# Patient Record
Sex: Female | Born: 1985 | Race: Black or African American | Hispanic: No | Marital: Single | Smoking: Former smoker
Health system: Southern US, Community
[De-identification: ages and names within clinical notes are randomized; demographics above are authoritative.]

## PROBLEM LIST (undated history)

## (undated) DIAGNOSIS — L0293 Carbuncle, unspecified: Secondary | ICD-10-CM

## (undated) DIAGNOSIS — L0292 Furuncle, unspecified: Secondary | ICD-10-CM

## (undated) HISTORY — PX: CERVICAL CONE BIOPSY: SUR198

## (undated) HISTORY — PX: OTHER SURGICAL HISTORY: SHX169

## (undated) HISTORY — PX: TONSILLECTOMY: SUR1361

## (undated) HISTORY — PX: ADENOIDECTOMY: SUR15

## (undated) HISTORY — PX: COLPOSCOPY: SHX161

## (undated) HISTORY — PX: CHOLECYSTECTOMY: SHX55

---
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
## 2004-03-17 ENCOUNTER — Emergency Department (HOSPITAL_COMMUNITY): Admission: EM | Admit: 2004-03-17 | Discharge: 2004-03-17 | Payer: Self-pay | Admitting: Emergency Medicine

## 2006-04-01 ENCOUNTER — Emergency Department (HOSPITAL_COMMUNITY): Admission: EM | Admit: 2006-04-01 | Discharge: 2006-04-01 | Payer: Self-pay | Admitting: Emergency Medicine

## 2006-08-25 ENCOUNTER — Emergency Department: Payer: Self-pay | Admitting: Emergency Medicine

## 2006-10-31 ENCOUNTER — Emergency Department: Payer: Self-pay | Admitting: Emergency Medicine

## 2006-11-07 ENCOUNTER — Emergency Department (HOSPITAL_COMMUNITY): Admission: EM | Admit: 2006-11-07 | Discharge: 2006-11-07 | Payer: Self-pay | Admitting: Emergency Medicine

## 2007-11-04 ENCOUNTER — Emergency Department (HOSPITAL_COMMUNITY): Admission: EM | Admit: 2007-11-04 | Discharge: 2007-11-04 | Payer: Self-pay | Admitting: Emergency Medicine

## 2008-03-04 ENCOUNTER — Inpatient Hospital Stay (HOSPITAL_COMMUNITY): Admission: AD | Admit: 2008-03-04 | Discharge: 2008-03-04 | Payer: Self-pay | Admitting: Obstetrics & Gynecology

## 2008-04-10 ENCOUNTER — Ambulatory Visit (HOSPITAL_COMMUNITY): Admission: RE | Admit: 2008-04-10 | Discharge: 2008-04-10 | Payer: Self-pay | Admitting: Family Medicine

## 2008-05-01 ENCOUNTER — Ambulatory Visit (HOSPITAL_COMMUNITY): Admission: RE | Admit: 2008-05-01 | Discharge: 2008-05-01 | Payer: Self-pay | Admitting: Family Medicine

## 2008-05-14 ENCOUNTER — Emergency Department (HOSPITAL_COMMUNITY): Admission: EM | Admit: 2008-05-14 | Discharge: 2008-05-14 | Payer: Self-pay | Admitting: Emergency Medicine

## 2008-05-22 ENCOUNTER — Ambulatory Visit (HOSPITAL_COMMUNITY): Admission: RE | Admit: 2008-05-22 | Discharge: 2008-05-22 | Payer: Self-pay | Admitting: Family Medicine

## 2008-06-19 ENCOUNTER — Ambulatory Visit (HOSPITAL_COMMUNITY): Admission: RE | Admit: 2008-06-19 | Discharge: 2008-06-19 | Payer: Self-pay | Admitting: Family Medicine

## 2008-07-20 ENCOUNTER — Inpatient Hospital Stay (HOSPITAL_COMMUNITY): Admission: AD | Admit: 2008-07-20 | Discharge: 2008-07-21 | Payer: Self-pay | Admitting: Obstetrics & Gynecology

## 2008-09-27 ENCOUNTER — Inpatient Hospital Stay (HOSPITAL_COMMUNITY): Admission: AD | Admit: 2008-09-27 | Discharge: 2008-09-27 | Payer: Self-pay | Admitting: Obstetrics & Gynecology

## 2008-10-22 ENCOUNTER — Inpatient Hospital Stay (HOSPITAL_COMMUNITY): Admission: AD | Admit: 2008-10-22 | Discharge: 2008-10-23 | Payer: Self-pay | Admitting: Obstetrics

## 2008-10-25 ENCOUNTER — Inpatient Hospital Stay (HOSPITAL_COMMUNITY): Admission: AD | Admit: 2008-10-25 | Discharge: 2008-10-29 | Payer: Self-pay | Admitting: Obstetrics & Gynecology

## 2009-05-20 ENCOUNTER — Emergency Department (HOSPITAL_COMMUNITY): Admission: EM | Admit: 2009-05-20 | Discharge: 2009-05-20 | Payer: Self-pay | Admitting: Emergency Medicine

## 2009-05-23 ENCOUNTER — Emergency Department (HOSPITAL_COMMUNITY): Admission: EM | Admit: 2009-05-23 | Discharge: 2009-05-23 | Payer: Self-pay | Admitting: Emergency Medicine

## 2009-06-08 ENCOUNTER — Emergency Department (HOSPITAL_COMMUNITY): Admission: EM | Admit: 2009-06-08 | Discharge: 2009-06-08 | Payer: Self-pay | Admitting: Emergency Medicine

## 2009-08-06 ENCOUNTER — Emergency Department (HOSPITAL_COMMUNITY): Admission: EM | Admit: 2009-08-06 | Discharge: 2009-08-06 | Payer: Self-pay | Admitting: Emergency Medicine

## 2009-11-01 ENCOUNTER — Emergency Department (HOSPITAL_COMMUNITY): Admission: EM | Admit: 2009-11-01 | Discharge: 2009-11-01 | Payer: Self-pay | Admitting: Emergency Medicine

## 2010-04-13 IMAGING — US US OB DETAIL+14 WK
1 series · 18 of 28 positions shown · non-contrast
Comparison: none

OBSTETRICAL ULTRASOUND:
 This ultrasound was performed in The [HOSPITAL], and the [facility] report will be stored to [REDACTED] PACS.

[Series 1: us ob detail+14 wk · 18 of 87 slices shown]
[im 1/87]
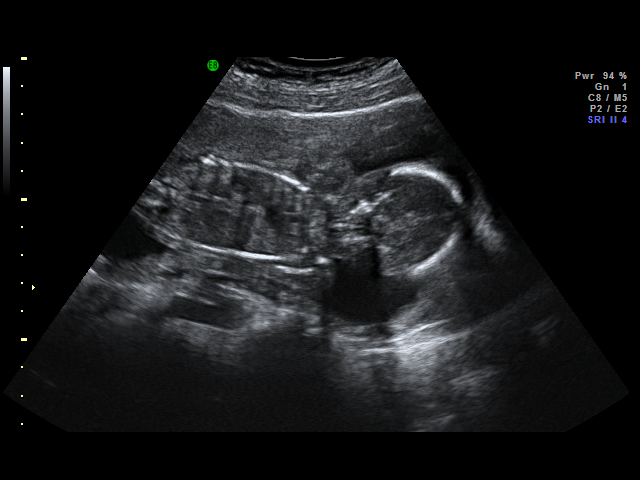
[im 7/87]
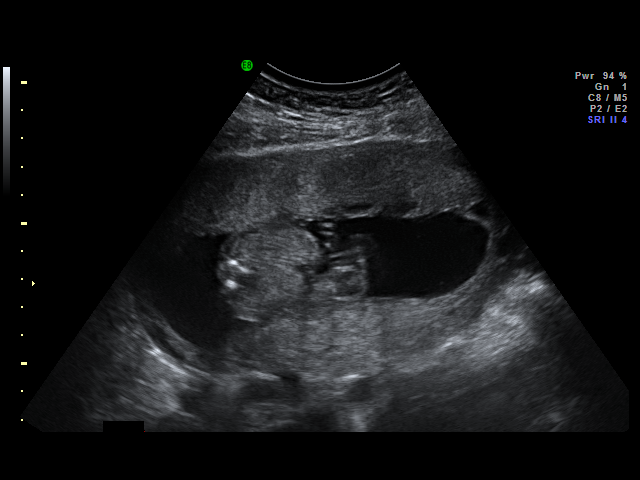
[im 10/87]
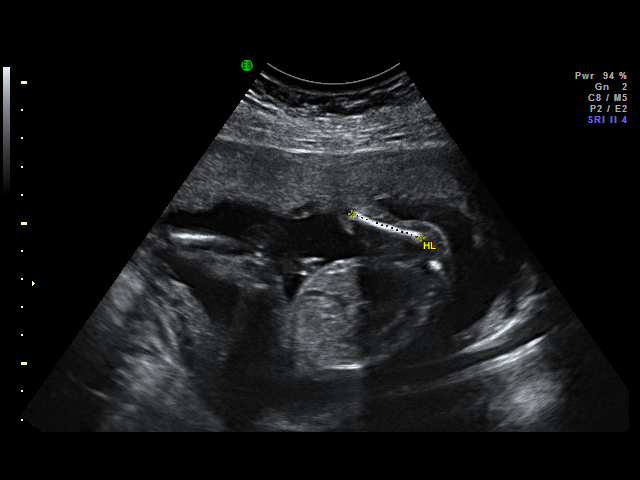
[im 16/87]
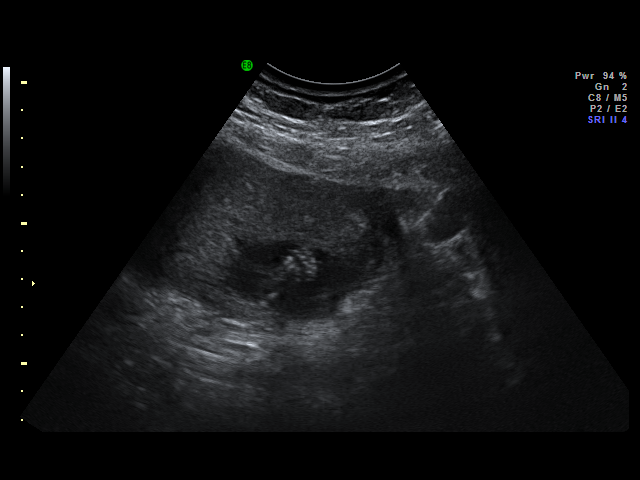
[im 23/87]
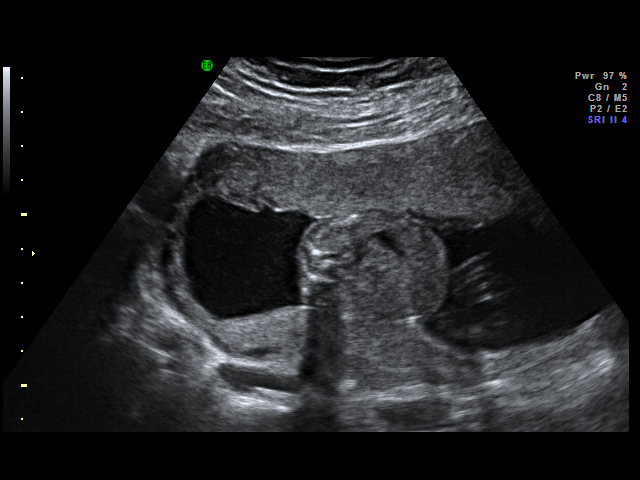
[im 26/87]
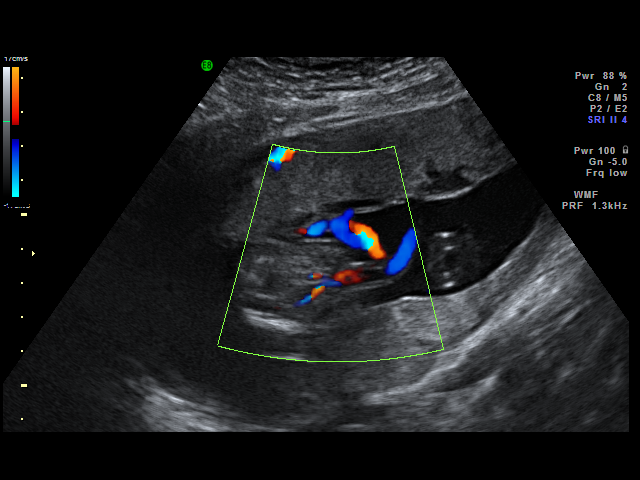
[im 32/87]
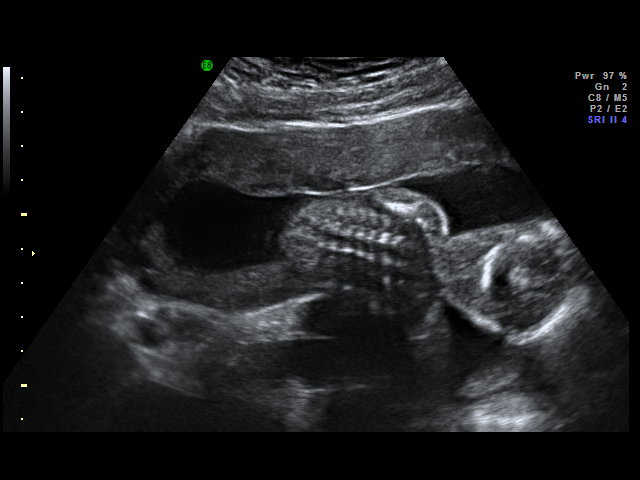
[im 36/87]
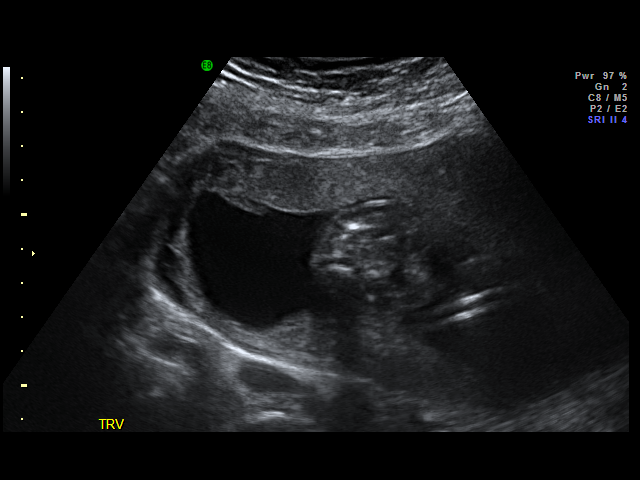
[im 42/87]
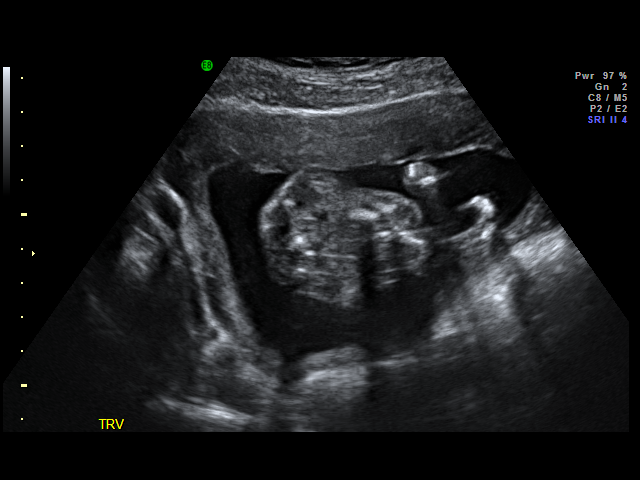
[im 45/87]
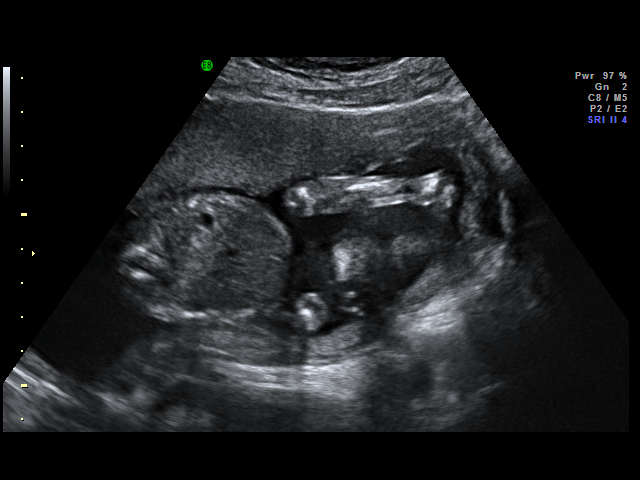
[im 51/87]
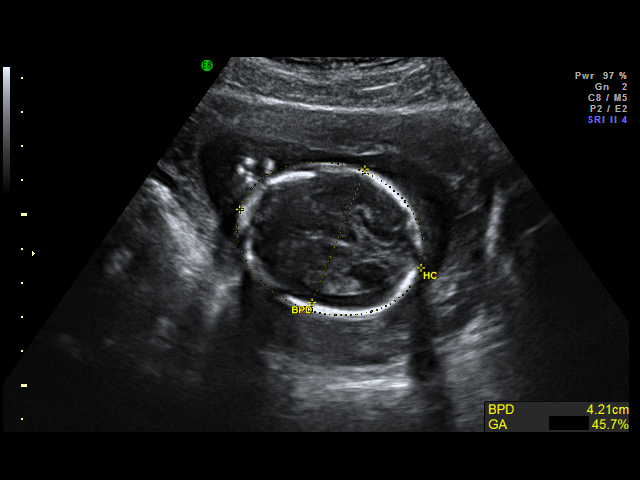
[im 55/87]
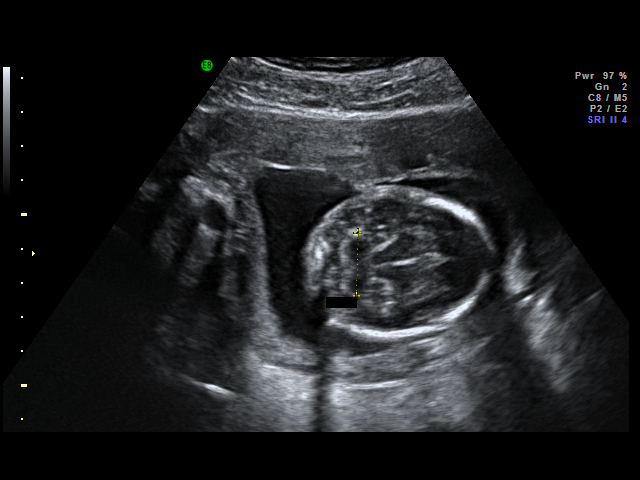
[im 61/87]
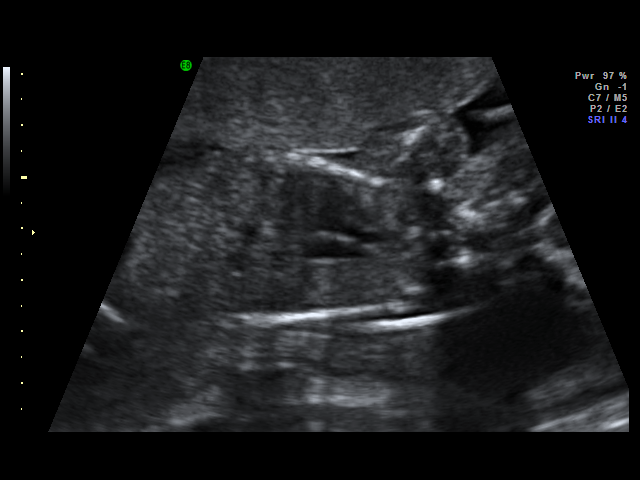
[im 67/87]
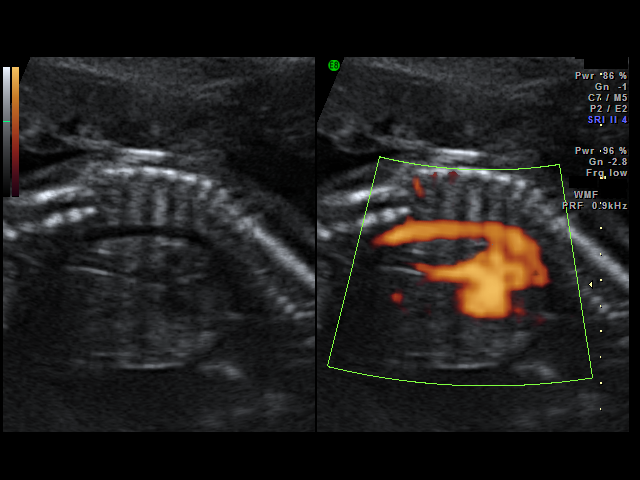
[im 71/87]
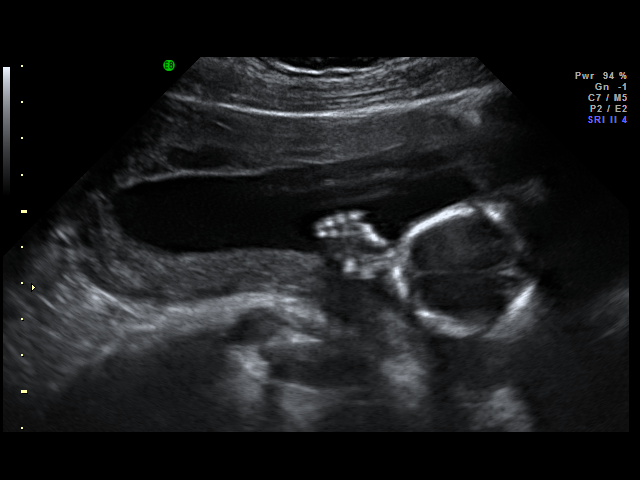
[im 77/87]
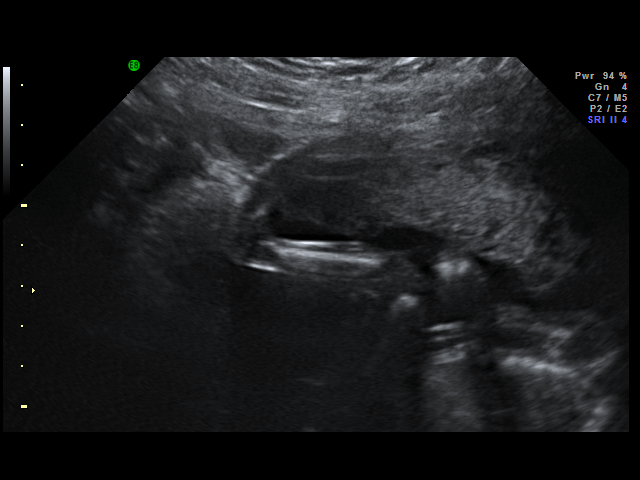
[im 80/87]
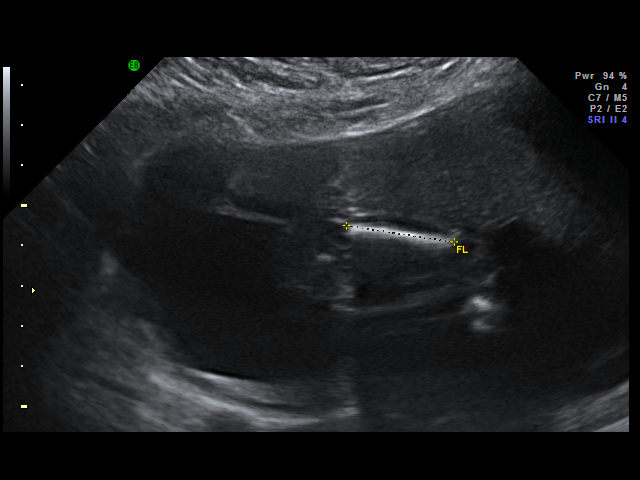
[im 87/87]
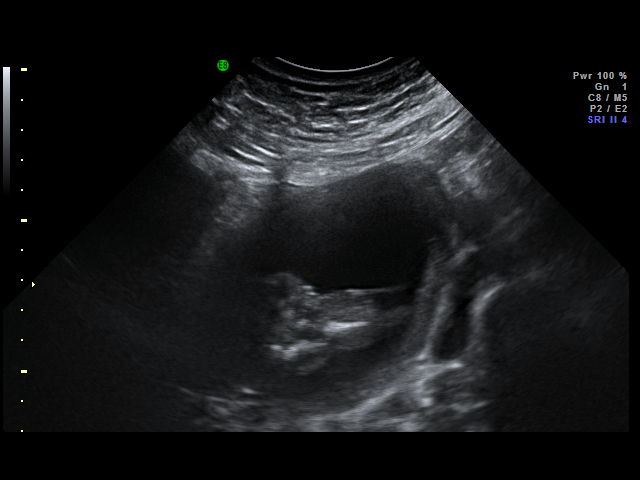

[18 of 28 positions shown; findings below may reference images not displayed]

IMPRESSION: [facility] has also been faxed to the ordering physician.

## 2010-04-26 ENCOUNTER — Emergency Department (HOSPITAL_COMMUNITY): Admission: EM | Admit: 2010-04-26 | Discharge: 2010-04-26 | Payer: Self-pay | Admitting: Family Medicine

## 2010-04-26 ENCOUNTER — Emergency Department (HOSPITAL_COMMUNITY): Admission: EM | Admit: 2010-04-26 | Discharge: 2010-04-26 | Payer: Self-pay | Admitting: Emergency Medicine

## 2010-06-29 ENCOUNTER — Emergency Department (HOSPITAL_COMMUNITY)
Admission: EM | Admit: 2010-06-29 | Discharge: 2010-06-30 | Payer: Self-pay | Source: Home / Self Care | Admitting: Emergency Medicine

## 2010-09-03 ENCOUNTER — Emergency Department (HOSPITAL_COMMUNITY)
Admission: EM | Admit: 2010-09-03 | Discharge: 2010-09-03 | Disposition: A | Discharge status: Home / Self Care | Payer: Medicaid Other | Attending: Emergency Medicine | Admitting: Emergency Medicine

## 2010-09-03 DIAGNOSIS — M25469 Effusion, unspecified knee: Secondary | ICD-10-CM | POA: Insufficient documentation

## 2010-09-03 DIAGNOSIS — M25569 Pain in unspecified knee: Secondary | ICD-10-CM | POA: Insufficient documentation

## 2010-10-03 ENCOUNTER — Ambulatory Visit (HOSPITAL_COMMUNITY)
Admission: RE | Admit: 2010-10-03 | Payer: Medicaid Other | Source: Ambulatory Visit | Admitting: Obstetrics & Gynecology

## 2010-10-04 LAB — WET PREP, GENITAL: Trich, Wet Prep: NONE SEEN

## 2010-10-04 LAB — GC/CHLAMYDIA PROBE AMP, GENITAL
Chlamydia, DNA Probe: NEGATIVE
GC Probe Amp, Genital: NEGATIVE

## 2010-10-07 ENCOUNTER — Ambulatory Visit (HOSPITAL_COMMUNITY)
Admission: RE | Admit: 2010-10-07 | Discharge: 2010-10-07 | Disposition: A | Discharge status: Home / Self Care | Payer: Medicaid Other | Source: Ambulatory Visit | Attending: Obstetrics & Gynecology | Admitting: Obstetrics & Gynecology

## 2010-10-07 DIAGNOSIS — N9089 Other specified noninflammatory disorders of vulva and perineum: Secondary | ICD-10-CM | POA: Insufficient documentation

## 2010-10-07 DIAGNOSIS — Z30432 Encounter for removal of intrauterine contraceptive device: Secondary | ICD-10-CM | POA: Insufficient documentation

## 2010-10-07 LAB — CBC
Hemoglobin: 11.9 g/dL — ABNORMAL LOW (ref 12.0–15.0)
MCH: 21.8 pg — ABNORMAL LOW (ref 26.0–34.0)
MCHC: 31.6 g/dL (ref 30.0–36.0)

## 2010-10-07 LAB — PREGNANCY, URINE: Preg Test, Ur: NEGATIVE

## 2010-10-10 ENCOUNTER — Other Ambulatory Visit: Payer: Self-pay | Admitting: Obstetrics & Gynecology

## 2010-10-13 NOTE — Op Note (Signed)
  NAMECALISE, Tammy [name]:  [id number]  MEDICAL RECORD NO.:  [id number]           PATIENT TYPE:  O  LOCATION:  [facility]                          [facility]  PHYSICIAN:  Roseanna Rainbow, M.D.DATE OF BIRTH:  01/31/1986  DATE OF PROCEDURE:  10/07/2010 DATE OF DISCHARGE:                              OPERATIVE REPORT   PREOPERATIVE DIAGNOSIS:  Left-sided vulvar cyst, intrauterine device in situ.  POSTOPERATIVE DIAGNOSIS:  Left-sided vulvar cyst, intrauterine device in situ.  PROCEDURE:  Excision of left vulvar cyst, removal of a Mirena intrauterine device.  SURGEON:  Roseanna Rainbow, MD.  ANESTHESIA:  Laryngeal mask airway, local.  COMPLICATIONS:  None.  ESTIMATED BLOOD LOSS:  100 mL.  FINDINGS:  There was a left-sided vulvar cyst in the labia majus. During the dissection, the cyst wall was ruptured and mucinous material was noted.  The IUD was removed intact.  PROCEDURE IN DETAILS:  The patient was taken to the operating room.  A laryngeal mask airway was placed.  She was placed in the semilithotomy position in Kep'el stirrups and prepped and draped in the usual sterile fashion.  After time-out had been completed, the overlying skin over the cyst was then incised and the dissection was carried down to the cyst wall.  Using sharp and blunt dissection, the cyst wall was enucleated. Bleeding points were clamped with hemostats.  These were secured with 3- 0 Vicryl sutures.  The defect was then closed in layers using running sutures of 2-0 Vicryl.  The skin was closed using interrupted subcuticular vertical mattress sutures using the 3-0 Vicryl suture.  The wound was irrigated.  Adequate hemostasis was noted.  A speculum was placed in the patient's vagina.  The IUD strings were grasped and the IUD was removed intact.  At the close of the procedure, the instrument and pack counts were said to be correct x2.  The patient was taken to the PACU awake  and in stable condition.     Roseanna Rainbow, M.D.     [name]  D:  10/07/2010  T:  10/08/2010  Job:  [id number]  Electronically Signed by Antionette Char M.D. on 10/13/2010 07:12:38 PM

## 2010-10-26 LAB — RPR: RPR Ser Ql: NONREACTIVE

## 2010-10-26 LAB — CBC
HCT: 31.9 % — ABNORMAL LOW (ref 36.0–46.0)
Hemoglobin: 9.9 g/dL — ABNORMAL LOW (ref 12.0–15.0)
MCHC: 31.8 g/dL (ref 30.0–36.0)
Platelets: 296 10*3/uL (ref 150–400)
Platelets: 307 10*3/uL (ref 150–400)
Platelets: 345 10*3/uL (ref 150–400)
RBC: 4.34 MIL/uL (ref 3.87–5.11)
RDW: 15.9 % — ABNORMAL HIGH (ref 11.5–15.5)
RDW: 16.6 % — ABNORMAL HIGH (ref 11.5–15.5)
RDW: 17.1 % — ABNORMAL HIGH (ref 11.5–15.5)

## 2010-10-26 LAB — CCBB MATERNAL DONOR DRAW

## 2010-10-31 LAB — URINALYSIS, ROUTINE W REFLEX MICROSCOPIC
Nitrite: NEGATIVE
Specific Gravity, Urine: <1.005 — ABNORMAL LOW (ref 1.005–1.030)
Urobilinogen, UA: 0.2 mg/dL (ref 0.0–1.0)

## 2010-11-29 NOTE — H&P (Signed)
Tammy Francis, Tammy [name]:  [id number]   MEDICAL RECORD NO.:  [id number]          PATIENT TYPE:  INP   LOCATION:  9167                          [facility]   PHYSICIAN:  Roseanna Rainbow, M.D.DATE OF BIRTH:  1986/05/20   DATE OF ADMISSION:  10/22/2008  DATE OF DISCHARGE:                              HISTORY & PHYSICAL   CHIEF COMPLAINT:  The patient is a 25 year old para 0 with an estimated  date of confinement of October 23, 2008, with an intrauterine pregnancy at  39 plus weeks complaining of contractions.   HISTORY OF PRESENT ILLNESS:  Please see the above.  While being  monitored in the Maternity Admission Unit there was a moderate variable  deceleration noted.  A subsequent BPP was 8/8.  The amniotic fluid index  was 7.49.  The fetal heart tracing was reviewed.  There were no further  decelerations.  The patient denies rupture of membranes.   ALLERGIES:  PENICILLIN and BEESTINGS.   MEDICATIONS:  Prenatal vitamins.   OB RISK FACTORS:  History of tobacco use.   PRENATAL LABS:  Chlamydia probe negative.  GC probe negative.  Urine  culture and sensitivity, no growth.  One hour GCT 111.  Hepatitis B  surface antigen negative.  Hematocrit 32.3.  Hemoglobin 10.2.  HIV  nonreactive.  Platelets 293,000.  Blood type is O positive.  Antibody  screen negative.  RPR nonreactive.  Rubella equivocal.  Sickle cell  negative.  GBS negative on September 16, 2008.   PAST GYN HISTORY:  There is history of cervical dysplasia.  She is  status post LEEP procedure.   PAST MEDICAL HISTORY:  No significant history of medical disease.   PAST SURGICAL HISTORY:  Tonsils and adenoid.   SOCIAL HISTORY:  She works in [employer].  She is single, does not  give any significant history of alcohol use.  Currently smokes less than  one-half pack per day, reports stress at work, and denies illicit drug  use.   FAMILY HISTORY:  Remarkable for diabetes mellitus.   PHYSICAL  EXAMINATION:  Vital signs stable, afebrile.  Fetal heart  tracing reassuring.  Tocodynamometer, regular uterine contractions.  Sterile vaginal exam per the RN.  The cervix was 1cm dilated, 60%  effaced.   ASSESSMENT:  Primigravida at term, borderline low amniotic fluid index,  prodromal labor.  Fetal heart tracing and biophysical profile consistent  with fetal well being.   PLAN:  Admission, possible augmentation of labor with low-dose Pitocin  per protocol.      Roseanna Rainbow, M.D.  Electronically Signed     LAJ/MEDQ  D:  10/22/2008  T:  10/23/2008  Job:  [id number]

## 2010-11-29 NOTE — Op Note (Signed)
Tammy Francis, Tammy Francis              ACCOUNT NO.:  [id number]   MEDICAL RECORD NO.:  [id number]          PATIENT TYPE:  INP   LOCATION:  9127                          [facility]   PHYSICIAN:  Roseanna Rainbow, M.D.DATE OF BIRTH:  [birth year]   DATE OF PROCEDURE:  10/26/2008  DATE OF DISCHARGE:                               OPERATIVE REPORT   PREOPERATIVE DIAGNOSES:  Intrauterine pregnancy at 40 plus weeks, arrest  of dilatation, active phase of labor, suspicious fetal heart tracing  with repetitive variable decelerations.   POSTOPERATIVE DIAGNOSES:  Intrauterine pregnancy at 40 plus weeks,  arrest of dilatation, active phase of labor, suspicious fetal heart  tracing with repetitive variable decelerations.   PROCEDURE:  Primary low uterine flap elliptical cesarean delivery via  Pfannenstiel skin incision.   SURGEON:  Roseanna Rainbow, MD   ANESTHESIA:  Epidural.   ESTIMATED BLOOD LOSS:  600 mL.   IV FLUIDS:  As per Anesthesiology   URINE OUTPUT:  As per Anesthesiology.   COMPLICATIONS:  None.   FINDINGS:  Live born female, occiput posterior position.  The weight was  7 pounds 5 ounces.   DESCRIPTION OF PROCEDURE:  The patient was taken to the operating room  with an IV running and an epidural catheter in situ.  She was placed in  the dorsal supine position with a leftward tilt and prepped and draped  in the usual sterile fashion.  After time-out had been completed, a  transverse skin incision was then made with a scalpel and carried down  to the underlying fascia with the Bovie.  The fascia was nicked in the  midline.  The fascial incision was then extended bilaterally with curved  Mayo scissors.  The superior aspect of the fascial incision was tented  up and the underlying rectus muscles dissected off.  The inferior aspect  of the fascial incision was manipulated in a similar fashion.  The  rectus muscles were separated in the midline.  The parietal  peritoneum  was tented up and entered.  This incision was then extended superiorly  and inferiorly with good visualization of the bladder.  The bladder  blade was then placed.  The vesicouterine peritoneum was tented up and  entered sharply.  This incision was then extended bilaterally and the  bladder flap created bluntly.  The bladder blade was then replaced.  The  lower uterine segment was incised in a transverse fashion with a  scalpel.  The incision was then extended bluntly.  The infant's head was  delivered atraumatically.  The oropharynx was suctioned with a bulb  suction.  The cord was clamped and cut.  The infant was handed off to  the awaiting neonatologist.  The placenta was then removed.  The  intrauterine cavity was evacuated of any remaining amniotic fluid clots  and debris with moist laparotomy sponge.  The uterine incision was then  reapproximated in a running interlocking fashion using a suture of 0  Monocryl.  A second imbricating layer of the same suture was then  placed.  Bleeding points were secured with figure-of-eight sutures of  the same.  Adequate hemostasis was noted.  The paracolic gutters were  then irrigated.  The parietal peritoneum was reapproximated in a running  fashion using a suture of 2-0 Vicryl.  The fascia was reapproximated  with two running sutures of 0 Vicryl tied in the midline.  The skin was  closed with staples.  After closure of the procedure, the instruments  and pack counts were said to be correct x2.  The patient was taken to  the PACU awake and in stable condition.      Roseanna Rainbow, M.D.  Electronically Signed     LAJ/MEDQ  D:  10/26/2008  T:  10/27/2008  Job:  [id number]

## 2010-11-29 NOTE — Discharge Summary (Signed)
Tammy [name]:  [id number]   MEDICAL RECORD NO.:  [id number]          PATIENT TYPE:  INP   LOCATION:  9127                          [facility]   PHYSICIAN:  Roseanna Rainbow, M.D.DATE OF BIRTH:  1986-03-23   DATE OF ADMISSION:  10/25/2008  DATE OF DISCHARGE:  10/29/2008                               DISCHARGE SUMMARY   CHIEF COMPLAINT:  The patient is a 25 year old para 0 with an estimated  date of confinement of October 23, 2008 with an intrauterine pregnancy at  40 plus weeks for induction of labor.  Please see the dictated history  and physical.   HOSPITAL COURSE:  The patient was admitted; the cervix was ripened with  Cervidil.  After the Cervidil was removed, low dose of Pitocin was  started.  The membranes were artificially ruptured in late labor at 5-6  cm of cervical dilatation.  At one point, there were variable  decelerations noted on the fetal heart tracing, an intrauterine pressure  catheter was placed and an amnioinfusion was started.  There was some  improvement in the fetal heart tracing transiently, however, the  variable decelerations recurred.  There was no further dilatation beyond  6 cm of dilatation.  In light of the fetal heart tracing, the [place]  units could not be increased beyond 130 MVUs.  At this point, the  decision was made to proceed with a cesarean delivery for arrest of  dilatation.  Please see the dictated operative summary.  On  postoperative day 1, the hemoglobin was 9.9, which was stable from a  preoperative value.  The remainder of her hospital course was  uneventful.  She was discharged to home on postoperative day 3,  tolerating regular diet.   A psychosocial assessment was also performed during the hospitalization  by a clinical social worker.   DISCHARGE DIAGNOSES:  1. Intrauterine pregnancy at term.  2. Arrest of dilatation.  3. Active phase of labor.   PROCEDURE:  Cesarean delivery.   CONDITION:  Good.   DIET:  Regular.   ACTIVITY:  Pelvic rest and progressive activity.   MEDICATIONS:  Darvocet and ibuprofen.   DISPOSITION:  The patient is to follow up in the office in 2 weeks.  A  referral was made to Child Protective Services per the social worker.      Roseanna Rainbow, M.D.  Electronically Signed     LAJ/MEDQ  D:  10/29/2008  T:  10/30/2008  Job:  [id number]

## 2010-11-29 NOTE — H&P (Signed)
Tammy Francis, FAZEKAS NO.:  [id number]   MEDICAL RECORD NO.:  [id number]          PATIENT TYPE:  INP   LOCATION:  9127                          [facility]   PHYSICIAN:  Roseanna Rainbow, M.D.DATE OF BIRTH:  [birth year]   DATE OF ADMISSION:  10/25/2008  DATE OF DISCHARGE:                              HISTORY & PHYSICAL   CHIEF COMPLAINT:  The patient is a 25 year old para 0 with an estimated  date of confinement of October 23, 2008, with an intrauterine pregnancy at  40 plus weeks for induction of labor.   HISTORY OF PRESENT ILLNESS:  The patient was recently admitted for  observation several days ago.  A biophysical profile was 8/8 at that  point and the amniotic fluid index was 7.49.  She was discharged to home  with the diagnosis of false labor.   ALLERGIES:  To PENICILLIN and BEE STINGS.   MEDICATIONS:  Prenatal vitamins.   OB RISK FACTORS:  History of tobacco use.   PRENATAL LABS:  Chlamydia probe negative, GC probe negative.  Urine  culture and sensitivity, no growth.  One-hour GCT 111.  Hepatitis B  surface antigen negative.  Hematocrit 32.3, hemoglobin 10.2, HIV  nonreactive, platelets 293,000.  Blood type O positive.  Antibody screen  negative.  RPR nonreactive, rubella equivocal.  Sickle cell negative.  GBS negative on September 16, 2008.   PAST GYN HISTORY:  There is history of cervical dysplasia.  She is  status post LEEP procedure.   PAST MEDICAL HISTORY:  No significant history of medical diseases.   PAST SURGICAL HISTORY:  Tonsils and adenoids.   SOCIAL HISTORY:  She works in [employer], she is single, does not give  any significant history of alcohol usage, currently smokes less than 1/2  pack per day.  Reports stress at work.  Denies illicit drug use.   FAMILY HISTORY:  Remarkable for diabetes mellitus.   PHYSICAL EXAMINATION:  VITAL SIGNS:  Stable, afebrile.  Fetal heart  tracing reassuring.  Tocodynamometer irregular uterine  contractions.  Sterile vaginal exam of the cervix per the RN is 1 cm dilated and 50%  effaced.   ASSESSMENT:  Primigravida at term, borderline low amniotic fluid index.  Fetal heart tracing reassuring.  Borderline Bishop score.   PLAN:  Admission, 2-stage induction of labor.      Roseanna Rainbow, M.D.  Electronically Signed     LAJ/MEDQ  D:  10/26/2008  T:  10/26/2008  Job:  [id number]

## 2010-12-02 NOTE — H&P (Signed)
NAME:  Tammy Francis, Tammy Francis                        ACCOUNT NO.:  [id number]   MEDICAL RECORD NO.:  [id number]                  PATIENT TYPE:   LOCATION:                                       FACILITY:  [facility]   PHYSICIAN:  Tilda Burrow, M.D.              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  Not willing to  consider outpatient therapy.   HISTORY OF PRESENT ILLNESS:  This 25 year old primiparous female referred to  our office courtesy of [facility] for colposcopy for  cervical dysplasia noted on Pap smear with cells suspicious for high-grade  lesion.  She was seen in our office on October 05, 2003, and colposcopic  evaluation showed dysplastic tissue at the squamocolumnar junction.  Biopsy  was taken at 6 o'clock on the cervix, which showed mild to moderate  dysplasia.  The patient has been seen back in our office and counseled over  the findings and recommended to have excision of the abnormal cells.  Consideration of cryocautery was declined.  The patient had a very tearful  response to cervical biopsy even after topical anesthetic had been applied.  So after considering things, she has decided to request anesthesia for the  cervical procedure rather than pericervical block in the office.  The pros  and cons of the procedure have been discussed to the patient's satisfaction.   PAST MEDICAL HISTORY:  Benign.  No injuries.   PAST SURGICAL HISTORY:  Tonsillectomy in 1998 in [place], [place].   MEDICATIONS:  None.   ALLERGIES:  None.   PHYSICAL EXAMINATION:  HEIGHT:  5 feet 4-1/2 inches.  WEIGHT:  190 pounds.  VITAL SIGNS:  Blood pressure 150/72.  GENERAL APPEARANCE:  A healthy African American female, alert and oriented x  3.  HEENT:  Pupils equal, round, and reactive.  Extraocular movements  intact.  NECK:  Supple.  Trachea midline.  CHEST:  Clear to auscultation.  ABDOMEN:  Nontender.  PELVIC:  Cervix with dysplasia at 6 o'clock on microscopic evaluation.  Adnexa negative for masses.   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   PLAN:  Hominum laser ablation of cervical lesion on November 05, 2003.    ___________________________________________                                         Tilda Burrow, M.D.   JVF/MEDQ  D:  10/27/2003  T:  10/27/2003  Job:  [id number]

## 2010-12-02 NOTE — Op Note (Signed)
NAME:  Tammy Francis, Tammy Francis                        ACCOUNT NO.:  [id number]   MEDICAL RECORD NO.:  [id number]                   PATIENT TYPE:  AMB   LOCATION:  DAY                                  FACILITY:  [facility]   PHYSICIAN:  Tilda Burrow, M.D.              DATE OF BIRTH:  1985-09-29   DATE OF PROCEDURE:  DATE OF DISCHARGE:                                 OPERATIVE REPORT   PREOPERATIVE DIAGNOSIS:  Cervical dysplasia, mild-to-moderate.   POSTOPERATIVE DIAGNOSIS:  Cervical dysplasia, mild-to-moderate.   PROCEDURE:  Holmium laser ablation of the cervix (conization).   SURGEON:  Tilda Burrow, M.D.   ASSISTANTRolm Baptise, C.S.T.   ANESTHESIA:  General with LMA.   COMPLICATIONS:  None.   FINDINGS:  A well-demarcated transition zone with previous biopsy-proven  cervical dysplasia, mild-to-moderate dysplasia.   DESCRIPTION OF PROCEDURE:  The patient was taken to the operating room and  prepped and draped with the legs in low lithotomy support.   A nonreflective speculum was inserted into the vagina.  The cervix was  prepped and the perineum draped with moistened laparotomy tapes.  We then  placed Lugol's solution on the cervix to demarcate the squamocolumnar  junction.  Then using the holmium laser at low to moderate settings as  documented in the record, we proceeded to use laser ablation of the surface  for a complete sloughing of the surface skin to a depth of approximately 3  to 5 mm.  The procedure left the cervix completely without bleeding  occurring.  A paracervical block with Marcaine had been applied prior to  beginning the procedure using 10 cc of Marcaine with epinephrine.   The patient was then allowed to go to the recovery room in good condition.  Sponge and needle counts were correct.      ___________________________________________                                            Tilda Burrow, M.D.   JVF/MEDQ  D:  11/13/2003  T:  11/13/2003  Job:  [id number]

## 2011-04-04 ENCOUNTER — Emergency Department (HOSPITAL_COMMUNITY): Payer: Medicaid Other

## 2011-04-04 ENCOUNTER — Emergency Department (HOSPITAL_COMMUNITY)
Admission: EM | Admit: 2011-04-04 | Discharge: 2011-04-04 | Disposition: A | Discharge status: Home / Self Care | Payer: Medicaid Other | Attending: Emergency Medicine | Admitting: Emergency Medicine

## 2011-04-04 DIAGNOSIS — M79609 Pain in unspecified limb: Secondary | ICD-10-CM | POA: Insufficient documentation

## 2011-04-04 DIAGNOSIS — R059 Cough, unspecified: Secondary | ICD-10-CM | POA: Insufficient documentation

## 2011-04-04 DIAGNOSIS — J069 Acute upper respiratory infection, unspecified: Secondary | ICD-10-CM | POA: Insufficient documentation

## 2011-04-04 DIAGNOSIS — R05 Cough: Secondary | ICD-10-CM | POA: Insufficient documentation

## 2011-04-11 LAB — POCT PREGNANCY, URINE
Operator id: 151321
Preg Test, Ur: NEGATIVE

## 2011-04-11 LAB — URINALYSIS, ROUTINE W REFLEX MICROSCOPIC
Bilirubin Urine: NEGATIVE
Ketones, ur: NEGATIVE
Nitrite: NEGATIVE
Urobilinogen, UA: 1

## 2011-04-11 LAB — URINE MICROSCOPIC-ADD ON

## 2011-05-23 ENCOUNTER — Encounter: Payer: Self-pay | Admitting: Emergency Medicine

## 2011-05-23 ENCOUNTER — Emergency Department (HOSPITAL_COMMUNITY)
Admission: EM | Admit: 2011-05-23 | Discharge: 2011-05-23 | Disposition: A | Discharge status: Home / Self Care | Payer: Self-pay | Attending: Emergency Medicine | Admitting: Emergency Medicine

## 2011-05-23 DIAGNOSIS — M79609 Pain in unspecified limb: Secondary | ICD-10-CM | POA: Insufficient documentation

## 2011-05-23 DIAGNOSIS — M7989 Other specified soft tissue disorders: Secondary | ICD-10-CM | POA: Insufficient documentation

## 2011-05-23 DIAGNOSIS — IMO0002 Reserved for concepts with insufficient information to code with codable children: Secondary | ICD-10-CM | POA: Insufficient documentation

## 2011-05-23 DIAGNOSIS — L02411 Cutaneous abscess of right axilla: Secondary | ICD-10-CM

## 2011-05-23 MED ORDER — LIDOCAINE HCL 1 % IJ SOLN
INTRAMUSCULAR | Status: AC
  Administered 2011-05-23: 16:00:00
  Filled 2011-05-23: qty 20

## 2011-05-23 MED ORDER — DOXYCYCLINE HYCLATE 100 MG PO CAPS: 100.0000 mg | ORAL_CAPSULE | Freq: Two times a day (BID) | ORAL | Status: AC

## 2011-05-23 MED ORDER — HYDROCODONE-ACETAMINOPHEN 5-325 MG PO TABS: 2.0000 | ORAL_TABLET | ORAL | Status: AC | PRN

## 2011-05-23 MED ORDER — LIDOCAINE HCL 2 % IJ SOLN: 10.0000 mL | Freq: Once | INTRAMUSCULAR | Status: DC

## 2011-05-23 NOTE — ED Provider Notes (Signed)
History     CSN: [id number] Arrival date & time: 05/23/2011 12:13 PM   First MD Initiated Contact with Patient 05/23/11 1431      Chief Complaint  Patient presents with  . Recurrent Skin Infections  . Toe Pain    (Consider location/radiation/quality/duration/timing/severity/associated sxs/prior treatment) Patient is a 25 y.o. female presenting with abscess.  Abscess  This is a new problem. The current episode started less than one week ago. The onset was gradual. The problem occurs rarely. The problem has been gradually worsening. Affected Location: R axilla. The problem is moderate. The abscess is characterized by redness, painfulness, draining and swelling. Pertinent negatives include no fever.    History reviewed. No pertinent past medical history.  History reviewed. No pertinent past surgical history.  No family history on file.  History  Substance Use Topics  . Smoking status: Not on file  . Smokeless tobacco: Not on file  . Alcohol Use: No    OB History    Grav Para Term Preterm Abortions TAB SAB Ect Mult Living                  Review of Systems  Constitutional: Negative for fever.  All other systems reviewed and are negative.    Allergies  Penicillins cross reactors  Home Medications  No current outpatient prescriptions on file.  BP 122/69  Pulse 70  Temp(Src) 98 F (36.7 C) (Oral)  Resp 20  Ht 5\' 5"  (1.651 m)  Wt 195 lb (88.451 kg)  BMI 32.45 kg/m2  SpO2 100%  Physical Exam  Constitutional: She appears well-developed and well-nourished.  Cardiovascular: Normal rate.   Pulmonary/Chest: Effort normal.  Skin: Skin is warm and dry.       R axilla abscess 3cm    ED Course  INCISION AND DRAINAGE Date/Time: 05/23/2011 3:45 PM Performed by: Jethro Bastos Authorized by: Jethro Bastos Consent: Verbal consent obtained. Written consent not obtained. Risks and benefits: risks, benefits and alternatives were discussed Consent given by:  patient Patient understanding: patient states understanding of the procedure being performed Patient consent: the patient's understanding of the procedure matches consent given Site marked: the operative site was marked Patient identity confirmed: verbally with patient, arm band, provided demographic data and hospital-assigned identification number Time out: Immediately prior to procedure a "time out" was called to verify the correct patient, procedure, equipment, support staff and site/side marked as required. Type: abscess Location: axilla. Anesthesia: local infiltration Patient sedated: no Scalpel size: 10 Needle gauge: 25. Incision type: single straight Complexity: simple Drainage: purulent Drainage amount: moderate Wound treatment: wound left open Packing material: 1/4 in iodoform gauze Patient tolerance: Patient tolerated the procedure well with no immediate complications.   (including critical care time)  Labs Reviewed - No data to display No results found.   No diagnosis found.    MDM          Jethro Bastos, NP 05/23/11 1547

## 2011-05-23 NOTE — ED Notes (Signed)
Abscess to rt axilla x 1 wk.  Tried to squeeze it but states it hurt too bad. Pt has stubbed LT pinky toe x 3 over the last 2 wks, last time was few days ago.

## 2011-05-23 NOTE — ED Notes (Signed)
Patient is resting comfortably. Has access to callbell, is watching TV and has been updated that the PA will see her shortly.

## 2011-05-23 NOTE — ED Notes (Signed)
Pt states that she has a boil to rt axillary and also pain to lt foot no inury

## 2011-05-23 NOTE — ED Provider Notes (Signed)
Medical screening examination/treatment/procedure(s) were performed by non-physician practitioner and as supervising physician I was immediately available for consultation/collaboration.   Alylah Blakney, MD 05/23/11 1656 

## 2011-05-27 ENCOUNTER — Encounter (HOSPITAL_COMMUNITY): Payer: Self-pay | Admitting: *Deleted

## 2011-05-27 ENCOUNTER — Emergency Department (HOSPITAL_COMMUNITY)
Admission: EM | Admit: 2011-05-27 | Discharge: 2011-05-27 | Disposition: A | Discharge status: Home / Self Care | Payer: Self-pay | Attending: Emergency Medicine | Admitting: Emergency Medicine

## 2011-05-27 DIAGNOSIS — F17200 Nicotine dependence, unspecified, uncomplicated: Secondary | ICD-10-CM | POA: Insufficient documentation

## 2011-05-27 DIAGNOSIS — IMO0002 Reserved for concepts with insufficient information to code with codable children: Secondary | ICD-10-CM | POA: Insufficient documentation

## 2011-05-27 DIAGNOSIS — L02411 Cutaneous abscess of right axilla: Secondary | ICD-10-CM

## 2011-05-27 NOTE — ED Provider Notes (Addendum)
History     CSN: [id number] Arrival date & time: 05/27/2011  8:43 PM   None     Chief Complaint  Patient presents with  . Wound Check    (Consider location/radiation/quality/duration/timing/severity/associated sxs/prior treatment) HPI  Patient was seen in emergency department 5 days ago for abscess in her right axilla who had I&D at that time and was placed on doxycycline returns to the emergency department as instructed for wound recheck. Patient states that her pain has been improving with a great improvement in the swelling and the pain. Patient denies fevers, chills, skin redness or drainage from incision site. Patient states packing has remained in place. Symptoms are gradually improving. No other associated symptoms. No aggravating or alleviating factors. History reviewed. No pertinent past medical history.  History reviewed. No pertinent past surgical history.  History reviewed. No pertinent family history.  History  Substance Use Topics  . Smoking status: Current Everyday Smoker  . Smokeless tobacco: Not on file  . Alcohol Use: No    OB History    Grav Para Term Preterm Abortions TAB SAB Ect Mult Living                  Review of Systems  All other systems reviewed and are negative.    Allergies  Vicodin and Penicillins cross reactors  Home Medications   Current Outpatient Rx  Name Route Sig Dispense Refill  . DOXYCYCLINE HYCLATE 100 MG PO CAPS Oral Take 1 capsule (100 mg total) by mouth 2 (two) times daily. 20 capsule 0  . HYDROCODONE-ACETAMINOPHEN 5-325 MG PO TABS Oral Take 2 tablets by mouth every 4 (four) hours as needed for pain (do not drive with this medication). 12 tablet 0    BP 120/64  Pulse 75  Temp(Src) 98.6 F (37 C) (Oral)  Resp 16  SpO2 100%  LMP 05/07/2011  Physical Exam  Nursing note and vitals reviewed. Constitutional: She is oriented to person, place, and time. She appears well-developed and well-nourished. No distress.  HENT:    Head: Normocephalic and atraumatic.  Eyes: Conjunctivae are normal.  Neck: Normal range of motion.  Cardiovascular: Normal rate, regular rhythm, normal heart sounds and intact distal pulses.  Exam reveals no gallop and no friction rub.   No murmur heard. Pulmonary/Chest: Effort normal and breath sounds normal. No respiratory distress. She has no wheezes. She has no rales. She exhibits no tenderness.  Abdominal: Soft. Bowel sounds are normal. She exhibits no distension. There is no tenderness.  Musculoskeletal: Normal range of motion. She exhibits no edema and no tenderness.  Neurological: She is alert and oriented to person, place, and time.  Skin: Skin is warm and dry. No rash noted. She is not diaphoretic. No erythema.       Well healing linear incision of right axilla with packing in place. Scant yellow discharge but no erythema or tenderness to palpation. No fluctuance or induration.  Psychiatric: She has a normal mood and affect.    ED Course  Procedures (including critical care time)  1 recheck with packing removal with prior procedure being an I&D 5 days ago. Well-healing no signs of infection.  Wound recheck with packing removed.  Labs Reviewed - No data to display No results found.   1. Abscess of axilla, right       MDM  Well healing abscess of right axilla with no signs or symptoms of cellulitis. No fluctuants induration of skin. Patient states wound is healing with improving pain.  Jenness Corner, PA 05/28/11 0050  Jenness Corner, PA 08/05/11 [phone]

## 2011-05-27 NOTE — ED Notes (Signed)
Pt in for wound recheck, pt states she had an abscess to right underarm packed and was told to come back to get it re-evaluated

## 2011-05-28 NOTE — ED Provider Notes (Signed)
Medical screening examination/treatment/procedure(s) were performed by non-physician practitioner and as supervising physician I was immediately available for consultation/collaboration.  Hurman Horn, MD 05/28/11 [phone]

## 2011-07-21 ENCOUNTER — Emergency Department (HOSPITAL_COMMUNITY)
Admission: EM | Admit: 2011-07-21 | Discharge: 2011-07-22 | Disposition: A | Discharge status: Home / Self Care | Payer: BC Managed Care – PPO | Attending: Emergency Medicine | Admitting: Emergency Medicine

## 2011-07-21 DIAGNOSIS — M79609 Pain in unspecified limb: Secondary | ICD-10-CM | POA: Insufficient documentation

## 2011-07-21 DIAGNOSIS — F17200 Nicotine dependence, unspecified, uncomplicated: Secondary | ICD-10-CM | POA: Insufficient documentation

## 2011-07-21 DIAGNOSIS — L0291 Cutaneous abscess, unspecified: Secondary | ICD-10-CM

## 2011-07-21 DIAGNOSIS — L299 Pruritus, unspecified: Secondary | ICD-10-CM | POA: Insufficient documentation

## 2011-07-21 DIAGNOSIS — R21 Rash and other nonspecific skin eruption: Secondary | ICD-10-CM | POA: Insufficient documentation

## 2011-07-21 DIAGNOSIS — L02419 Cutaneous abscess of limb, unspecified: Secondary | ICD-10-CM | POA: Insufficient documentation

## 2011-07-22 ENCOUNTER — Encounter (HOSPITAL_COMMUNITY): Payer: Self-pay | Admitting: *Deleted

## 2011-07-22 MED ORDER — LIDOCAINE HCL 2 % IJ SOLN
INTRAMUSCULAR | Status: AC
  Filled 2011-07-22: qty 1

## 2011-07-22 MED ORDER — LIDOCAINE HCL (PF) 1 % IJ SOLN
5.0000 mL | Freq: Once | INTRAMUSCULAR | Status: DC
  Filled 2011-07-22: qty 5

## 2011-07-22 MED ORDER — CLINDAMYCIN HCL 150 MG PO CAPS: 150.0000 mg | ORAL_CAPSULE | Freq: Four times a day (QID) | ORAL | Status: AC

## 2011-07-22 NOTE — ED Notes (Signed)
Starting Sunday, the pt notices a pimple sized bump on her left medial thigh that has gotten progressively larger.  Circa 0400 yesterday morning, the reddened area around said bump was approximately 2.5 inches in diameter as seen by marking that the pt drew around said reddened area.  As of tonight, circa 2300, the reddened area has grown approximately 1.5 inches in diameter.  Pt denies oozing from the site but does state that the site itches and burns.

## 2011-07-22 NOTE — ED Notes (Signed)
Per EDP Dierdre Highman, room set up for the I and D procedure. Material at the bedside.

## 2011-07-22 NOTE — ED Provider Notes (Signed)
History     CSN: [id number]  Arrival date & time 07/21/11  2308   First MD Initiated Contact with Patient 07/22/11 [phone]      Chief Complaint  Patient presents with  . Abscess    multiple areas    (Consider location/radiation/quality/duration/timing/severity/associated sxs/prior treatment) Patient is a 26 y.o. female presenting with abscess. The history is provided by the patient.  Abscess  This is a new problem. The current episode started yesterday. The problem occurs continuously. The problem has been gradually worsening. The abscess is present on the left upper leg. The problem is moderate. The abscess is characterized by itchiness, redness and painfulness. Associated with: She had one on her right inner thigh and has now spread to her left inner thigh. The abscess first occurred at home. Pertinent negatives include no fever and no vomiting. There were no sick contacts. She has received no recent medical care.   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 There is some surrounding redness. Moderate in severity. Pain is sharp in quality and not radiating. Constant since onset.  History reviewed. No pertinent past medical history.  Past Surgical History  Procedure Date  . Bartholins cyst     had gland removed.  . Colposcopy     History reviewed. No pertinent family history.  History  Substance Use Topics  . Smoking status: Current Everyday Smoker  . Smokeless tobacco: Not on file  . Alcohol Use: No    OB History    Grav Para Term Preterm Abortions TAB SAB Ect Mult Living                  Review of Systems  Constitutional: Negative for fever and chills.  HENT: Negative for neck pain and neck stiffness.   Eyes: Negative for pain.  Respiratory: Negative for shortness of breath.   Cardiovascular: Negative for chest pain and palpitations.  Gastrointestinal: Negative for vomiting and  abdominal pain.  Genitourinary: Negative for dysuria.  Musculoskeletal: Negative for back pain.  Skin: Positive for rash.  Neurological: Negative for headaches.  All other systems reviewed and are negative.    Allergies  Vicodin and Penicillins cross reactors  Home Medications  No current outpatient prescriptions on file.  BP 126/68  Pulse 80  Temp(Src) 98.8 F (37.1 C) (Oral)  Resp 18  SpO2 100%  Physical Exam  Constitutional: She is oriented to person, place, and time. She appears well-developed and well-nourished.  HENT:  Head: Normocephalic and atraumatic.  Eyes: Conjunctivae and EOM are normal. Pupils are equal, round, and reactive to light.  Neck: Trachea normal. Neck supple. No thyromegaly present.  Cardiovascular: Normal rate, regular rhythm, S1 normal, S2 normal and normal pulses.     No systolic murmur is present   No diastolic murmur is present  Pulses:      Radial pulses are 2+ on the right side, and 2+ on the left side.  Pulmonary/Chest: Effort normal and breath sounds normal. She has no wheezes. She has no rhonchi. She has no rales. She exhibits no tenderness.  Abdominal: Normal appearance. There is no CVA tenderness and negative Murphy's sign.  Musculoskeletal:       Left lower extremity: Medial thigh with 2-3 cm area of tenderness, fluctuance and pointing. There is surrounding erythema with increased warmth to touch. No lymphadenopathy. No streaking. Distal neurovascular is intact  Neurological: She is alert  and oriented to person, place, and time. She has normal strength. No cranial nerve deficit or sensory deficit. GCS eye subscore is 4. GCS verbal subscore is 5. GCS motor subscore is 6.  Skin: Skin is warm and dry. No rash noted. She is not diaphoretic.  Psychiatric: Her speech is normal.       Cooperative and appropriate    ED Course  INCISION AND DRAINAGE Date/Time: 07/22/2011 4:58 AM Performed by: Sunnie Nielsen Authorized by: Sunnie Nielsen Consent:  Verbal consent obtained. Risks and benefits: risks, benefits and alternatives were discussed Consent given by: patient Patient understanding: patient states understanding of the procedure being performed Patient consent: the patient's understanding of the procedure matches consent given Procedure consent: procedure consent matches procedure scheduled Patient identity confirmed: verbally with patient Time out: Immediately prior to procedure a "time out" was called to verify the correct patient, procedure, equipment, support staff and site/side marked as required. Type: abscess Location: Left medial thigh. Anesthesia: local infiltration Local anesthetic: lidocaine 1% without epinephrine Anesthetic total: 2 ml Risk factor: underlying major vessel, coagulopathy and underlying major nerve Scalpel size: 11 Needle gauge: 22 Incision type: single with marsupialization Complexity: simple Drainage: serosanguinous and purulent Drainage amount: moderate Wound treatment: wound left open Patient tolerance: Patient tolerated the procedure well with no immediate complications.   (including critical care time)    MDM   Abscess with I&D as above. There is some associated cellulitis. Antibiotics prescribed. Plan recheck 48 hours for any worsening condition. Reliable historian states understanding all discharge and followup instructions        Sunnie Nielsen, MD 07/22/11 [phone]

## 2011-07-22 NOTE — ED Notes (Signed)
Pt alert, pt presents with 1cm raised area to left thigh, tender to touch, non open, 4cm discoloration noted around raised area, no warmth noted

## 2011-08-06 NOTE — ED Provider Notes (Signed)
Medical screening examination/treatment/procedure(s) were performed by non-physician practitioner and as supervising physician I was immediately available for consultation/collaboration.  Adon Gehlhausen M Clifton Kovacic, MD 08/06/11 0136 

## 2011-10-25 ENCOUNTER — Emergency Department (HOSPITAL_COMMUNITY)
Admission: EM | Admit: 2011-10-25 | Discharge: 2011-10-25 | Disposition: A | Discharge status: Home / Self Care | Payer: BC Managed Care – PPO | Attending: Emergency Medicine | Admitting: Emergency Medicine

## 2011-10-25 DIAGNOSIS — M79646 Pain in unspecified finger(s): Secondary | ICD-10-CM

## 2011-10-25 DIAGNOSIS — S60449A External constriction of unspecified finger, initial encounter: Secondary | ICD-10-CM

## 2011-10-25 DIAGNOSIS — W4909XA Other specified item causing external constriction, initial encounter: Secondary | ICD-10-CM | POA: Insufficient documentation

## 2011-10-25 DIAGNOSIS — M79609 Pain in unspecified limb: Secondary | ICD-10-CM | POA: Insufficient documentation

## 2011-10-25 DIAGNOSIS — S60949A Unspecified superficial injury of unspecified finger, initial encounter: Secondary | ICD-10-CM | POA: Insufficient documentation

## 2011-10-25 MED ORDER — OXYCODONE-ACETAMINOPHEN 5-325 MG PO TABS
1.0000 | ORAL_TABLET | Freq: Once | ORAL | Status: AC
  Administered 2011-10-25: 1 via ORAL
  Filled 2011-10-25: qty 1

## 2011-10-25 MED ORDER — IBUPROFEN 800 MG PO TABS: 800.0000 mg | ORAL_TABLET | Freq: Three times a day (TID) | ORAL | Status: AC | PRN

## 2011-10-25 NOTE — Discharge Instructions (Signed)
Please exercise your finger over the next few days.  Use ice and ibuprofen as needed for pain.  If you develop worsening swelling, redness, increased pain, weakness or numbness, return to the ER for a recheck.  You may return to the ER at any time for worsening condition or any new symptoms that concern you.   If you have no primary doctor, here are some resources that may be helpful:  Medicaid-accepting [facility] Providers:   - [facility]- [address]      [phone]      Mon-Fri 9am-7pm, Sat 9am-1pm   - [facility]- [address] [place]      [phone]   - [facility]- [address], [address] [place]      [phone]   [facility]- [address]      [phone]   - Renaye Rakers- [address] [place], [address]      [phone]      Only accepts [place] patients       after they have her name applied to their card   Self Pay (no insurance) in [place]:   - Sickle Cell Patients: Dr Willey Blade, [facility]      [address] [place]      [phone]   - [phone]   - Physician Referral Service- [phone]   - [facility]- [address] [place]      [phone]   [facility]- [address]   - [facility]- see information above      (Speak to [employer] if you do not have insurance)   - [facility]- [address] [place]      [phone]   - [facility] [place]- [address]      [phone]   - [facility]- [address]      [phone]   - Dr Julio Sicks-  [address], [place]      [phone]   - [facility]- [address]      [phone]   - [facility]- [address]      [phone]      Also [address]      [phone]   - [facility]- [address]      [phone]      1st and 3rd  Saturday every month, 10am-1pm Other agencies that provide inexpensive medical care:    [facility]  [phone]    [facility]  [phone]    [facility]  [phone]    [facility]  [phone]    [facility]  [phone]  General Information: Finding a doctor when you do not have health insurance can be tricky. Although you are not limited by an insurance plan, you are of course limited by her finances and how much but he can pay out of pocket.  What are your options if you don't have health insurance?   1) Find a Librarian, academic and Pay Out of Pocket Although you won't have to find out who is covered by your insurance plan, it is a good idea to ask around and get recommendations. You will then need to call the office and see  if the doctor you have chosen will accept you as a new patient and what types of options they offer for patients who are self-pay. Some doctors offer discounts or will set up payment plans for their patients who do not have insurance, but you will need to ask so you aren't surprised when you get to your appointment.  2) Contact Your Local Health Department Not all health departments have doctors that can see patients for sick visits, but many do, so it is worth a call to see if yours does. If you don't know where your local health department is, you can check in your phone book. The CDC also has a tool to help you locate your state's health department, and many state websites also have listings of all of their local health departments.  3) Find a Walk-in Clinic If your illness is not likely to be very severe or complicated, you may want to try a walk in clinic. These are popping up all over the country in pharmacies, drugstores, and shopping centers. They're usually staffed by nurse practitioners or physician assistants that have been trained to treat common illnesses and complaints. They're usually fairly quick and inexpensive. However, if  you have serious medical issues or chronic medical problems, these are probably not your best option   RESOURCE GUIDE  Dental Problems  Patients with Medicaid: [facility] [phone]                                           [phone] [address] [employer] Phone:  [phone]                                                  Phone:  [phone]  If unable to pay or uninsured, contact:  [facility] or [facility]. to become qualified for the adult dental clinic.  Chronic Pain Problems Contact [facility]  [phone] Patients need to be referred by their primary care doctor.  Insufficient Money for Medicine Contact [employer]:  call "211" or [facility] [phone].  No Primary Care Doctor Call [facility]  [phone] Other agencies that provide inexpensive medical care    [facility]  [phone]    [facility]  [phone]    [facility]  [phone]    [facility]  [phone]    [facility]  [phone]    [facility]  [phone]  Psychological Services [facility]  [phone] [facility] Services  [phone] [facility]   [phone] (emergency services [phone])  Substance Abuse Resources [facility]  [phone] [facility] [phone] [facility] [place] [phone] Floydene Flock [phone] Residential & Outpatient Substance Abuse Program  8544121264  Abuse/Neglect [facility] Child Abuse Hotline [phone] [facility] Child Abuse Hotline [phone] (After Hours)  Emergency Shelter [facility] [phone]  Maternity Homes Room at the [place] [facility] [place] [phone] [facility] Services [phone]  MRSA Hotline #:   289-004-6934    [facility]  Clinic of [place]     [employer]                           [facility] Dept. [address] [place]                       [address]      [address]  Blondell Reveal Phone:  [phone]                                   Phone:  [phone]                 Phone:  [phone]  [facility] Mental Health Phone:  [phone]  [facility] Child Abuse Hotline [phone] (After Hours)

## 2011-10-25 NOTE — ED Provider Notes (Signed)
History     CSN: [id number]  Arrival date & time 10/25/11  0272   First MD Initiated Contact with Patient 10/25/11 0915      Chief Complaint  Patient presents with  . Hand Pain    (Consider location/radiation/quality/duration/timing/severity/associated sxs/prior treatment) HPI Comments: Patient reports she could arrange on her right middle finger yesterday.  That was too small and it is become stuck.  States she has tried to remove it with butter, cooking oil, raising her hand above her head, ice, and various other methods without success.  Reports pain in her finger and swelling.  Denies difficulty moving the finger or weakness or numbness of the finger.  Patient is a 26 y.o. female presenting with hand pain. The history is provided by the patient.  Hand Pain    No past medical history on file.  Past Surgical History  Procedure Date  . Bartholins cyst     had gland removed.  . Colposcopy     No family history on file.  History  Substance Use Topics  . Smoking status: Current Everyday Smoker  . Smokeless tobacco: Not on file  . Alcohol Use: No    OB History    Grav Para Term Preterm Abortions TAB SAB Ect Mult Living                  Review of Systems  All other systems reviewed and are negative.    Allergies  Vicodin and Penicillins cross reactors  Home Medications   Current Outpatient Rx  Name Route Sig Dispense Refill  . IBUPROFEN 800 MG PO TABS Oral Take 400 mg by mouth every 8 (eight) hours as needed. For pain and headaches      BP 127/83  Pulse 77  Temp(Src) 99 F (37.2 C) (Oral)  Resp 16  SpO2 100%  LMP 10/13/2011  Physical Exam  Nursing note and vitals reviewed. Constitutional: She is oriented to person, place, and time. She appears well-developed and well-nourished.  HENT:  Head: Normocephalic and atraumatic.  Neck: Neck supple.  Pulmonary/Chest: Effort normal.  Musculoskeletal:       Right hand: She exhibits swelling. She exhibits  normal range of motion, no tenderness, normal capillary refill and no deformity.       Right third finger is edematous with stainless steel ring at base that is greater than 1 cm wide.  Patient has nearly full active range of motion, sensation intact, capillary refill less than 2 seconds.  Neurological: She is alert and oriented to person, place, and time.    ED Course  Procedures (including critical care time)  Labs Reviewed - No data to display No results found.  9:46 AM I attempted to remove the ring with a ring cutter, but was unable to get past the surface.  I have requested for a stronger staff member to assist me in ring removal.    11:45 AM We have had multiple staff members attempt to remove the ring.  We have requested and used our ring cutter here and newer ring cutters from [facility] without success. Patient is currently holding her hand in the air with an ice pack - after about 30 minutes of this I attempted to take the ring off using lubricant but was unsuccessful.  I spoke with Dr Karma Ganja about this patient and she has seen and examined her.  She recommends finger traps and call to hand surgeon.    11:59 AM Dr Melvyn Novas suggests calling OR  for more tool options.  The nurse previously called asking for ring cutters.  Dr Melvyn Novas suggests wire cutters or calling the OR director to request an instrument that cuts stainless steel.    12:37 PM Jon, ortho tech, removed ring with string and lubricant.  Patient reports great relief.  Pt with full AROM after removal, sensation intact, capillary refill < 2 seconds. No breaks in skin.     1. Pain in finger   2. Constrictive jewelry of finger       MDM  Patient, who had very wide stainless steel ring stuck on right middle finger with distal swelling.  After multiple attempts, the ring was finally removed by orthopedic tech charge nurse's assistance.  Patient's finger was neurovascularly intact before and after ring removal.  Discharged  home with ibuprofen, return precautions.  Patient verbalizes understanding and agrees with plan.          Rise Patience, [place] 10/25/11 1302

## 2011-10-25 NOTE — ED Provider Notes (Signed)
Medical screening examination/treatment/procedure(s) were conducted as a shared visit with non-physician practitioner(s) and myself.  I personally evaluated the patient during the encounter  Pt seen and evaluated- thick approx 1cm width stainless steel ring stuck on finger- finger is swollen but distally NVI- ring cutters not able to cut ring- hand surgery consulted and recommended calling OR for tool that will cut stainless steel- OR has been called, nothing like that available.  Ortho tech removed ring with string  Ethelda Chick, MD 10/25/11 1350

## 2011-10-25 NOTE — ED Notes (Signed)
Ring stuck on right middle finger since yesterday.

## 2011-10-25 NOTE — ED Notes (Signed)
Ring removed by ortho. Tech using suture. No breaks in skin or bruising noted on discharge.

## 2011-10-25 NOTE — ED Notes (Signed)
Multiple attempts to remove ring without success.

## 2011-10-27 NOTE — Progress Notes (Signed)
Late entry For 10/25/11 Pt listed as self pay with no insurance coverage Pt confirms she is self pay [place] resident.  PMH Diabetes CM and [facility] community liaison spoke with her Pt offered [facility] services to assist with finding a [place] self pay provider She refused  information Has pcp but can not recall pcp name

## 2012-11-26 ENCOUNTER — Encounter (HOSPITAL_COMMUNITY): Payer: Self-pay

## 2012-11-26 ENCOUNTER — Emergency Department (HOSPITAL_COMMUNITY)
Admission: EM | Admit: 2012-11-26 | Discharge: 2012-11-26 | Disposition: A | Discharge status: Home / Self Care | Payer: BC Managed Care – PPO | Attending: Emergency Medicine | Admitting: Emergency Medicine

## 2012-11-26 ENCOUNTER — Emergency Department (HOSPITAL_COMMUNITY): Payer: BC Managed Care – PPO

## 2012-11-26 DIAGNOSIS — N920 Excessive and frequent menstruation with regular cycle: Secondary | ICD-10-CM | POA: Insufficient documentation

## 2012-11-26 DIAGNOSIS — N921 Excessive and frequent menstruation with irregular cycle: Secondary | ICD-10-CM

## 2012-11-26 DIAGNOSIS — Z872 Personal history of diseases of the skin and subcutaneous tissue: Secondary | ICD-10-CM | POA: Insufficient documentation

## 2012-11-26 DIAGNOSIS — L0291 Cutaneous abscess, unspecified: Secondary | ICD-10-CM

## 2012-11-26 DIAGNOSIS — L02419 Cutaneous abscess of limb, unspecified: Secondary | ICD-10-CM | POA: Insufficient documentation

## 2012-11-26 DIAGNOSIS — L03119 Cellulitis of unspecified part of limb: Secondary | ICD-10-CM | POA: Insufficient documentation

## 2012-11-26 DIAGNOSIS — F17200 Nicotine dependence, unspecified, uncomplicated: Secondary | ICD-10-CM | POA: Insufficient documentation

## 2012-11-26 DIAGNOSIS — R109 Unspecified abdominal pain: Secondary | ICD-10-CM | POA: Insufficient documentation

## 2012-11-26 HISTORY — DX: Furuncle, unspecified: L02.92

## 2012-11-26 HISTORY — DX: Carbuncle, unspecified: L02.93

## 2012-11-26 LAB — WET PREP, GENITAL: Yeast Wet Prep HPF POC: NONE SEEN

## 2012-11-26 LAB — URINALYSIS, ROUTINE W REFLEX MICROSCOPIC
Glucose, UA: NEGATIVE mg/dL
Leukocytes, UA: NEGATIVE
Nitrite: NEGATIVE
Protein, ur: NEGATIVE mg/dL
pH: 6.5 (ref 5.0–8.0)

## 2012-11-26 LAB — URINE MICROSCOPIC-ADD ON

## 2012-11-26 MED ORDER — SULFAMETHOXAZOLE-TRIMETHOPRIM 800-160 MG PO TABS: 1.0000 | ORAL_TABLET | Freq: Two times a day (BID) | ORAL | Status: DC

## 2012-11-26 NOTE — ED Provider Notes (Signed)
History     CSN: [id number]  Arrival date & time 11/26/12  1106   First MD Initiated Contact with Patient 11/26/12 1124      Chief Complaint  Patient presents with  . Recurrent Skin Infections     boils to inner thigh  . Menometrorrhagia    3 periods in one month    (Consider location/radiation/quality/duration/timing/severity/associated sxs/prior treatment) HPI Comments: Patient presents to the ED for her boils to right inner thigh. States she has had several episodes of this, some previously requiring I&D.  She usually takes a course of abx and they will go away but always return, sometimes in different places.  Prior episodes to the left inner thigh, axilla and now right inner thigh.  Patient has been applying warm compresses to area without improvement of symptoms. Denies any drainage from the boil.    Is also complaining of menorrhagia. Patient states she's had approximately 3 periods this month, each lasting 5-7 days with 2-3 days in between. Bleeding is heavy with clots. She used to take birth-control pills and had an IUD briefly for regulation of menstrual cycle but notes these made her symptoms worse. Patient admits to new sexual partner but denies any concern for STDs at this time.  Patient also endorses intermittent suprapubic cramping associated with her cycles. Tried taking ibuprofen and apply heating pad to her abdomen without relief of symptoms.  No hx of anemia from menorrhagia.  Denies any chest pain, SOB, palpitations, dizziness, weakness, fever, sweats, or chills.  The history is provided by the patient.    Past Medical History  Diagnosis Date  . Recurrent boils     Past Surgical History  Procedure Laterality Date  . Bartholins cyst      had gland removed.  . Colposcopy    . Tonsillectomy    . Cervical cone biopsy      No family history on file.  History  Substance Use Topics  . Smoking status: Current Every Day Smoker  . Smokeless tobacco: Not on file    . Alcohol Use: No    OB History   Grav Para Term Preterm Abortions TAB SAB Ect Mult Living                  Review of Systems  Gastrointestinal: Positive for abdominal pain.  Genitourinary: Positive for menstrual problem.  All other systems reviewed and are negative.    Allergies  Vicodin and Penicillins cross reactors  Home Medications  No current outpatient prescriptions on file.  BP 120/75  Pulse 85  Temp(Src) 98.2 F (36.8 C)  Resp 20  SpO2 99%  LMP 11/20/2012  Physical Exam  Nursing note and vitals reviewed. Constitutional: She is oriented to person, place, and time. She appears well-developed and well-nourished.  HENT:  Head: Normocephalic and atraumatic.  Mouth/Throat: Oropharynx is clear and moist.  Eyes: Conjunctivae and EOM are normal.  Neck: Normal range of motion. Neck supple.  Cardiovascular: Normal rate, regular rhythm and normal heart sounds.   Pulmonary/Chest: Effort normal and breath sounds normal.  Abdominal: Soft. Bowel sounds are normal. There is tenderness in the suprapubic area. There is no CVA tenderness, no tenderness at McBurney's point and negative Murphy's sign.  Mild suprapubic tenderness  Genitourinary: There is no tenderness on the right labia. There is tenderness on the left labia. Cervix exhibits no motion tenderness. Right adnexum displays no mass and no tenderness. Left adnexum displays no mass and no tenderness. There is bleeding around  the vagina.  Profuse vaginal bleeding, cannot appreciate vaginal discharge due to large amount of bleeding, no adnexal or CMT  Musculoskeletal: Normal range of motion. She exhibits no edema.       Legs: Neurological: She is alert and oriented to person, place, and time.  Skin: Skin is warm and dry.  Psychiatric: She has a normal mood and affect.    ED Course  Procedures (including critical care time)  Labs Reviewed  URINALYSIS, ROUTINE W REFLEX MICROSCOPIC   US Transvaginal  Non-ob  11/26/2012  *RADIOLOGY REPORT*  Clinical Data: Menorrhagia with pelvic pain.  Bleeding for 1 month  TRANSABDOMINAL AND TRANSVAGINAL ULTRASOUND OF PELVIS Technique:  Both transabdominal and transvaginal ultrasound examinations of the pelvis were performed. Transabdominal technique was performed for global imaging of the pelvis including uterus, ovaries, adnexal regions, and pelvic cul-de-sac.  It was necessary to proceed with endovaginal exam following the transabdominal exam to visualize the myometrium, endometrium and adnexa.  Comparison:  None  Findings:  Uterus: Is anteverted and anteflexed and demonstrates a sagittal length of 8.6 cm, depth of 4.0 cm and width of 4.6 cm.  A homogeneous myometrium is seen.  Endometrium: Is thin and echogenic with a width of 2.5 mm. No areas of focal thickening or heterogeneity are identified. A focal subendometrial echogenic focus in the upper uterine segment portion is compatible with mucus within a subendometrial gland and is a benign finding.  Right ovary:  Has a normal appearance measuring 3.4 x 1.6 x 2.2 cm  Left ovary: Has a normal appearance measuring 3.3 x 1.8 x 1.8 cm  Other findings: No pelvic fluid or separate adnexal masses are seen  IMPRESSION: Unremarkable pelvic ultrasound with thin endometrial lining.   Original Report Authenticated By: Rhodia Albright, M.D.    US Pelvis Complete  11/26/2012  *RADIOLOGY REPORT*  Clinical Data: Menorrhagia with pelvic pain.  Bleeding for 1 month  TRANSABDOMINAL AND TRANSVAGINAL ULTRASOUND OF PELVIS Technique:  Both transabdominal and transvaginal ultrasound examinations of the pelvis were performed. Transabdominal technique was performed for global imaging of the pelvis including uterus, ovaries, adnexal regions, and pelvic cul-de-sac.  It was necessary to proceed with endovaginal exam following the transabdominal exam to visualize the myometrium, endometrium and adnexa.  Comparison:  None  Findings:  Uterus: Is  anteverted and anteflexed and demonstrates a sagittal length of 8.6 cm, depth of 4.0 cm and width of 4.6 cm.  A homogeneous myometrium is seen.  Endometrium: Is thin and echogenic with a width of 2.5 mm. No areas of focal thickening or heterogeneity are identified. A focal subendometrial echogenic focus in the upper uterine segment portion is compatible with mucus within a subendometrial gland and is a benign finding.  Right ovary:  Has a normal appearance measuring 3.4 x 1.6 x 2.2 cm  Left ovary: Has a normal appearance measuring 3.3 x 1.8 x 1.8 cm  Other findings: No pelvic fluid or separate adnexal masses are seen  IMPRESSION: Unremarkable pelvic ultrasound with thin endometrial lining.   Original Report Authenticated By: Rhodia Albright, M.D.      1. Abscess   2. Menometrorrhagia       MDM   27 y.o. F presenting to the ED for right thigh boil and irregular menstrual cycles with suprapubic cramping.  Urinalysis: - color- yellow - clear - ph 6.5 - spec gravity 1.015 - glucose, ketones, protein- negative - blood- large - bilirubin- negative - urobili- 1.0 Nitrate negative Leuk est negative  Wet prep:  No yeast or trich, rare clue cells and WBC  Pt does not want I&D of boil, wants to try course of abx.  Rx Bactrim. Pelvic U/s negative for acute findings- no fibroids or cysts to explain dysfunctional bleeding.  Pt non-toxic appearing, NAD, no signs/sx of anemia, VS stable- pt ok for d/c. Pt will be referred to [facility] for further eval and management of menstrual irregularities.  Discussed this with pt, she agreed.  Return precautions advised.     Garlon Hatchet, PA-C 11/26/12 1608  Garlon Hatchet, PA-C 11/26/12 [phone]

## 2012-11-26 NOTE — ED Notes (Signed)
Pelvic is Set up and ready. Dr. Rosalia Hammers is aware

## 2012-11-26 NOTE — ED Notes (Signed)
See triage

## 2012-11-27 NOTE — ED Provider Notes (Signed)
History/physical exam/procedure(s) were performed by non-physician practitioner and as supervising physician I was immediately available for consultation/collaboration. I have reviewed all notes and am in agreement with care and plan.   Uniqua Kihn S Yama Nielson, MD 11/27/12 0736 

## 2013-02-19 ENCOUNTER — Emergency Department (HOSPITAL_COMMUNITY)
Admission: EM | Admit: 2013-02-19 | Discharge: 2013-02-19 | Disposition: A | Discharge status: Home / Self Care | Payer: BC Managed Care – PPO | Attending: Emergency Medicine | Admitting: Emergency Medicine

## 2013-02-19 ENCOUNTER — Encounter (HOSPITAL_COMMUNITY): Payer: Self-pay | Admitting: Emergency Medicine

## 2013-02-19 DIAGNOSIS — Z872 Personal history of diseases of the skin and subcutaneous tissue: Secondary | ICD-10-CM | POA: Insufficient documentation

## 2013-02-19 DIAGNOSIS — F17200 Nicotine dependence, unspecified, uncomplicated: Secondary | ICD-10-CM | POA: Insufficient documentation

## 2013-02-19 DIAGNOSIS — W268XXA Contact with other sharp object(s), not elsewhere classified, initial encounter: Secondary | ICD-10-CM | POA: Insufficient documentation

## 2013-02-19 DIAGNOSIS — Y939 Activity, unspecified: Secondary | ICD-10-CM | POA: Insufficient documentation

## 2013-02-19 DIAGNOSIS — Z79899 Other long term (current) drug therapy: Secondary | ICD-10-CM | POA: Insufficient documentation

## 2013-02-19 DIAGNOSIS — Z880 Allergy status to penicillin: Secondary | ICD-10-CM | POA: Insufficient documentation

## 2013-02-19 DIAGNOSIS — S01501A Unspecified open wound of lip, initial encounter: Secondary | ICD-10-CM | POA: Insufficient documentation

## 2013-02-19 DIAGNOSIS — K130 Diseases of lips: Secondary | ICD-10-CM

## 2013-02-19 DIAGNOSIS — Y929 Unspecified place or not applicable: Secondary | ICD-10-CM | POA: Insufficient documentation

## 2013-02-19 MED ORDER — IBUPROFEN 800 MG PO TABS
800.0000 mg | ORAL_TABLET | Freq: Once | ORAL | Status: AC
  Administered 2013-02-19: 800 mg via ORAL
  Filled 2013-02-19: qty 1

## 2013-02-19 MED ORDER — CEPHALEXIN 500 MG PO CAPS: 500.0000 mg | ORAL_CAPSULE | Freq: Four times a day (QID) | ORAL | Status: DC

## 2013-02-19 MED ORDER — KETOROLAC TROMETHAMINE 60 MG/2ML IM SOLN: 60.0000 mg | Freq: Once | INTRAMUSCULAR | Status: DC

## 2013-02-19 MED ORDER — NAPROXEN 500 MG PO TABS: 500.0000 mg | ORAL_TABLET | Freq: Two times a day (BID) | ORAL | Status: DC

## 2013-02-19 NOTE — ED Notes (Signed)
Pt got facial piercing. Yesterday it was swollen so pt went back to tattoo artist who told her probably a keloid developed and put vit E on it. Pt states that when she woke up this morning her lip and right side of face was swollen pt put ice and took ibuprofen and helped some with the swelling.

## 2013-02-19 NOTE — ED Provider Notes (Signed)
CSN: [id number]     Arrival date & time 02/19/13  1314 History     First MD Initiated Contact with Patient 02/19/13 1340     Chief Complaint  Patient presents with  . Facial Swelling   (Consider location/radiation/quality/duration/timing/severity/associated sxs/prior Treatment) The history is provided by the patient. No language interpreter was used.  Tammy Francis is a 27 y/o F presenting to the ED with swelling to the lower lip after a facial piercing. Patient reported that she had her lip pierced approximately 2 months ago while in [place], [place] - reported that the lip became irritated this weekend. Stated that she noticed that her lip was painful and that it became swollen. Stated that she called the store to speak with the artist that performed - artist stated that it was a keloid and recommended patient to gargle with mouthwash, gargle with warm water and salt, and to crush teeth. Patient reported that when she woke up this morning she noticed that her lower lip was swollen and painful - described the discomfort to be of a pins-and-needles sensation with an underlying pressure sensation, with radiation to the right cheek. Patient reported that she used Ibuprofen and ice which aided in relieving some of the swelling. Denied drainage, pus, bleeding, neck stiffness, neck pain, eye complaints, ear complaints, difficulty swallowing, pain with swallowing.  PCP none  Past Medical History  Diagnosis Date  . Recurrent boils    Past Surgical History  Procedure Laterality Date  . Bartholins cyst      had gland removed.  . Colposcopy    . Tonsillectomy    . Cervical cone biopsy     No family history on file. History  Substance Use Topics  . Smoking status: Current Every Day Smoker  . Smokeless tobacco: Not on file  . Alcohol Use: No   OB History   Grav Para Term Preterm Abortions TAB SAB Ect Mult Living                 Review of Systems  Constitutional: Negative for fever and  chills.  HENT: Negative for ear pain, sore throat and trouble swallowing.        Lip pain and swelling  Eyes: Negative for pain and visual disturbance.  Respiratory: Negative for cough, chest tightness and shortness of breath.   Cardiovascular: Negative for chest pain.  Neurological: Negative for dizziness, weakness and headaches.  All other systems reviewed and are negative.    Allergies  Vicodin and Penicillins cross reactors  Home Medications   Current Outpatient Rx  Name  Route  Sig  Dispense  Refill  . acetaminophen (TYLENOL) 500 MG tablet   Oral   Take 1,000 mg by mouth every 6 (six) hours as needed for pain.         . cephALEXin (KEFLEX) 500 MG capsule   Oral   Take 1 capsule (500 mg total) by mouth 4 (four) times daily.   20 capsule   0   . naproxen (NAPROSYN) 500 MG tablet   Oral   Take 1 tablet (500 mg total) by mouth 2 (two) times daily.   30 tablet   0    BP 146/77  Pulse 72  Temp(Src) 99 F (37.2 C) (Oral)  Resp 18  SpO2 100%  LMP 02/13/2013 Physical Exam  Nursing note and vitals reviewed. Constitutional: She is oriented to person, place, and time. She appears well-developed and well-nourished. No distress.  HENT:  Head: Normocephalic and  atraumatic.  Mouth/Throat: Oropharynx is clear and moist. No oropharyngeal exudate.    Piercing located to right aspect of lower lip. Mild swelling noted to the right side of the bottom lip. Negative erythema, inflammation, warmth upon palpation. Discomfort noted when bottom lip palpated. Negative drainage, negative bleeding. Negative drainage with palpation to lip. Negative hard lesion palpated. Crusting of a yellow discoloration noted to the stud piercing of the lip. Negative damage noted to dentition. Mild discomfort when patient opens mouth secondary to pain with pulling of the lip - negative trismus, negative jaw pain.     Eyes: Conjunctivae and EOM are normal. Pupils are equal, round, and reactive to light.  Right eye exhibits no discharge. Left eye exhibits no discharge.  Neck: Normal range of motion. Neck supple.  Negative neck stiffness Negative nuchal rigidity Negative LAD Mild discomfort upon palpation to the right side of the neck - referred pain  Cardiovascular: Normal rate, regular rhythm and normal heart sounds.  Exam reveals no friction rub.   No murmur heard. Pulses:      Radial pulses are 2+ on the right side, and 2+ on the left side.  Pulmonary/Chest: Effort normal and breath sounds normal. No respiratory distress. She has no wheezes. She has no rales.  Lymphadenopathy:    She has no cervical adenopathy.  Neurological: She is alert and oriented to person, place, and time. No cranial nerve deficit. She exhibits normal muscle tone. Coordination normal.  Cranial nerves III-XII grossly intact   Skin: Skin is warm and dry. No rash noted. She is not diaphoretic. No erythema.  Psychiatric: She has a normal mood and affect. Her behavior is normal. Thought content normal.    ED Course   Procedures (including critical care time)  2:30PM Spoke with Dr. Rosalia Hammers - as per physician, recommended piercing to be removed.   Labs Reviewed - No data to display No results found. 1. Pierced lip infection     MDM  Patient presenting to the ED with lip ring piercing infection to the left side of the lower lip that started over the weekend and started to get worse today.  Alert and oriented. Swelling noted to the lower lip - negative erythema, inflammation, drainage, bleeding, warmth to palpation. Crusting noted around the stud piercing. Negative damage to dentition noted. Piercing removed - mild bleeding noted - patient tolerated procedure well. Patient stable, mild low-grad fever - Ibuprofen given in ED setting. Patient discharged with antibiotics and anti-inflammatories. Discussed with patient to rest and stay hydrated. Discussed with patient to continue to gargle with warm water and salt. [facility]erred  [facility]. Discussed with patient to continue to monitor symptoms and if symptoms are to worsen or change to report back to the ED - strict return instructions given. Patient agreed to plan of care, understood, all questions answered.   [employer], PA-C 02/20/13 [phone]

## 2013-02-20 ENCOUNTER — Telehealth (HOSPITAL_COMMUNITY): Payer: Self-pay | Admitting: Emergency Medicine

## 2013-02-20 NOTE — ED Provider Notes (Signed)
Spoke with [employer] secretary, at 7:37PM - discontinues Keflex and started patient on Bactrim DS instead for better Staph coverage.   Raymon Mutton, PA-C 02/20/13 [id number]

## 2013-02-21 NOTE — ED Provider Notes (Signed)
History/physical exam/procedure(s) were performed by non-physician practitioner and as supervising physician I was immediately available for consultation/collaboration. I have reviewed all notes and am in agreement with care and plan.   Hilario Quarry, MD 02/21/13 2159

## 2013-02-21 NOTE — ED Provider Notes (Signed)
Medical screening examination/treatment/procedure(s) were performed by non-physician practitioner and as supervising physician I was immediately available for consultation/collaboration.  Flint Melter, MD 02/21/13 [phone]

## 2013-05-21 ENCOUNTER — Encounter (HOSPITAL_COMMUNITY): Payer: Self-pay | Admitting: Emergency Medicine

## 2013-05-21 ENCOUNTER — Emergency Department (HOSPITAL_COMMUNITY)
Admission: EM | Admit: 2013-05-21 | Discharge: 2013-05-22 | Disposition: A | Discharge status: Home / Self Care | Payer: BC Managed Care – PPO | Attending: Emergency Medicine | Admitting: Emergency Medicine

## 2013-05-21 DIAGNOSIS — Z880 Allergy status to penicillin: Secondary | ICD-10-CM | POA: Insufficient documentation

## 2013-05-21 DIAGNOSIS — Z872 Personal history of diseases of the skin and subcutaneous tissue: Secondary | ICD-10-CM | POA: Insufficient documentation

## 2013-05-21 DIAGNOSIS — F17200 Nicotine dependence, unspecified, uncomplicated: Secondary | ICD-10-CM | POA: Insufficient documentation

## 2013-05-21 DIAGNOSIS — K8020 Calculus of gallbladder without cholecystitis without obstruction: Secondary | ICD-10-CM | POA: Insufficient documentation

## 2013-05-21 DIAGNOSIS — Z3202 Encounter for pregnancy test, result negative: Secondary | ICD-10-CM | POA: Insufficient documentation

## 2013-05-21 NOTE — ED Notes (Signed)
Pt reports back pain that goes around  to the front of her stomach and down stomach area. Back pain has been going on for a month and the stomach pain for 2 weeks. Pt states she has nausea in the morning.

## 2013-05-22 ENCOUNTER — Emergency Department (HOSPITAL_COMMUNITY): Payer: BC Managed Care – PPO

## 2013-05-22 LAB — URINALYSIS, ROUTINE W REFLEX MICROSCOPIC
Bilirubin Urine: NEGATIVE
Glucose, UA: NEGATIVE mg/dL
Ketones, ur: NEGATIVE mg/dL
Leukocytes, UA: NEGATIVE
pH: 6 (ref 5.0–8.0)

## 2013-05-22 LAB — COMPREHENSIVE METABOLIC PANEL
AST: 23 U/L (ref 0–37)
Albumin: 3.5 g/dL (ref 3.5–5.2)
Alkaline Phosphatase: 82 U/L (ref 39–117)
Chloride: 105 mEq/L (ref 96–112)
Potassium: 4 mEq/L (ref 3.5–5.1)
Sodium: 140 mEq/L (ref 135–145)
Total Bilirubin: 0.2 mg/dL — ABNORMAL LOW (ref 0.3–1.2)

## 2013-05-22 LAB — CBC WITH DIFFERENTIAL/PLATELET
Basophils Absolute: 0 10*3/uL (ref 0.0–0.1)
HCT: 36.4 % (ref 36.0–46.0)
Lymphocytes Relative: 48 % — ABNORMAL HIGH (ref 12–46)
Monocytes Relative: 8 % (ref 3–12)
Neutro Abs: 3.7 10*3/uL (ref 1.7–7.7)
Platelets: 288 10*3/uL (ref 150–400)
RDW: 15.7 % — ABNORMAL HIGH (ref 11.5–15.5)
WBC: 8.6 10*3/uL (ref 4.0–10.5)

## 2013-05-22 MED ORDER — GI COCKTAIL ~~LOC~~
30.0000 mL | Freq: Once | ORAL | Status: AC
  Administered 2013-05-22: 30 mL via ORAL
  Filled 2013-05-22: qty 30

## 2013-05-22 MED ORDER — OXYCODONE-ACETAMINOPHEN 5-325 MG PO TABS
1.0000 | ORAL_TABLET | Freq: Once | ORAL | Status: AC
  Administered 2013-05-22: 1 via ORAL
  Filled 2013-05-22: qty 1

## 2013-05-22 MED ORDER — PANTOPRAZOLE SODIUM 20 MG PO TBEC
20.0000 mg | DELAYED_RELEASE_TABLET | Freq: Once | ORAL | Status: AC
  Administered 2013-05-22: 20 mg via ORAL
  Filled 2013-05-22: qty 1

## 2013-05-22 MED ORDER — OXYCODONE-ACETAMINOPHEN 5-325 MG PO TABS: 1.0000 | ORAL_TABLET | ORAL | Status: DC | PRN

## 2013-05-22 MED ORDER — ONDANSETRON HCL 8 MG PO TABS: 8.0000 mg | ORAL_TABLET | Freq: Three times a day (TID) | ORAL | Status: DC | PRN

## 2013-05-22 NOTE — ED Provider Notes (Signed)
Medical screening examination/treatment/procedure(s) were conducted as a shared visit with non-physician practitioner(s) and myself.  I personally evaluated the patient during the encounter.  EKG Interpretation   None       Pt with mid back pain, upper abd pain. Labs. U/s. abd soft nt on recheck. Afeb.   Suzi Roots, MD 05/22/13 1524

## 2013-05-22 NOTE — ED Provider Notes (Signed)
CSN: [id number]     Arrival date & time 05/21/13  2300 History   First MD Initiated Contact with Patient 05/22/13 0037     Chief Complaint  Patient presents with  . Back Pain   (Consider location/radiation/quality/duration/timing/severity/associated sxs/prior Treatment) HPI History provided by pt.   Pt has had pain across mid back that radiates to her abdomen for the past month.  Intermittent and most prominent w/ supine positioning.  2 weeks ago she developed pain in epigastrium that radiates around to mid-back, throughout abd and occasionally into chest.  Associated w/ early satiety, nausea w/ retching, particularly in am, and constipation.  Most recent BM 1 week ago.  LMP 05/09/13.  No h/o abd surgeries.   Past Medical History  Diagnosis Date  . Recurrent boils    Past Surgical History  Procedure Laterality Date  . Bartholins cyst      had gland removed.  . Colposcopy    . Tonsillectomy    . Cervical cone biopsy     History reviewed. No pertinent family history. History  Substance Use Topics  . Smoking status: Current Every Day Smoker  . Smokeless tobacco: Not on file  . Alcohol Use: No   OB History   Grav Para Term Preterm Abortions TAB SAB Ect Mult Living                 Review of Systems  All other systems reviewed and are negative.    Allergies  Vicodin and Penicillins cross reactors  Home Medications   Current Outpatient Rx  Name  Route  Sig  Dispense  Refill  . acetaminophen (TYLENOL) 500 MG tablet   Oral   Take 1,000 mg by mouth every 6 (six) hours as needed for pain.          BP 134/93  Pulse 71  Temp(Src) 98.4 F (36.9 C) (Oral)  Resp 19  Ht 5\' 4"  (1.626 m)  Wt 210 lb (95.255 kg)  BMI 36.03 kg/m2  SpO2 98%  LMP 05/09/2013 Physical Exam  Nursing note and vitals reviewed. Constitutional: She is oriented to person, place, and time. She appears well-developed and well-nourished. No distress.  HENT:  Head: Normocephalic and atraumatic.  Eyes:   Normal appearance  Neck: Normal range of motion.  Cardiovascular: Normal rate and regular rhythm.   Pulmonary/Chest: Effort normal and breath sounds normal. No respiratory distress.  Abdominal: Soft. Bowel sounds are normal. She exhibits no distension and no mass. There is no rebound and no guarding.  Diffuse ttp, reportedly worst in epigastrium.   Genitourinary:  Bilateral CVA tenderness  Musculoskeletal: Normal range of motion.  Tenderness across mid-back at level of ~T6-T10  Neurological: She is alert and oriented to person, place, and time.  Skin: Skin is warm and dry. No rash noted.  Psychiatric: She has a normal mood and affect. Her behavior is normal.    ED Course  Procedures (including critical care time) Labs Review Labs Reviewed  CBC WITH DIFFERENTIAL - Abnormal; Notable for the following:    RBC 5.39 (*)    MCV 67.5 (*)    MCH 22.3 (*)    RDW 15.7 (*)    Lymphocytes Relative 48 (*)    Lymphs Abs 4.1 (*)    All other components within normal limits  COMPREHENSIVE METABOLIC PANEL - Abnormal; Notable for the following:    Glucose, Bld 100 (*)    Total Bilirubin 0.2 (*)    All other components within normal limits  LIPASE, BLOOD - Abnormal; Notable for the following:    Lipase 92 (*)    All other components within normal limits  URINALYSIS, ROUTINE W REFLEX MICROSCOPIC - Abnormal; Notable for the following:    APPearance CLOUDY (*)    All other components within normal limits  POCT PREGNANCY, URINE   Imaging Review No results found.  EKG Interpretation   None       MDM   1. Cholelithiasis    27yo healthy F who is a poor historian, presents w/ diffuse abd pain that seems to originate in epigastrium and is aggravated by eating and associated w/ nausea x 2 weeks, as well as non-traumatic mid-back pain that radiates to abd x 1 month.  She has attributed the back pain to the large size of her breasts.  On exam, afebrile, NAD, abd soft/non-distended, diffusely  ttp, worst in epigastrium, no stool impaction, mid-back tenderness from ~T6-T10.  Labs and [place] abd to r/o cholelithiasis pending.  Pt received a GI cocktail and po protonix to treat possible gastritis, but no relief of pain.  Percocet ordered.  Labs sig for mildly elevated lipase.  [place] positive for cholelithiasis w/out cholecystitis.  Results discussed w/ pt.  Her pain is improved but still rates it a 6/10.  Is tolerating pos.  Will give another dose of pain medication, reassess, and if pain level and exam improved, d/c home w/ percocet, zofran and referral to [facility].  I recommended clear liquid diet x 24 hours and avoidance of fatty foods following.  Return precautions discussed. 5:47 AM    Otilio Miu, PA-C 05/22/13 [phone]

## 2013-05-29 ENCOUNTER — Emergency Department (HOSPITAL_COMMUNITY)
Admission: EM | Admit: 2013-05-29 | Discharge: 2013-05-30 | Disposition: A | Discharge status: Home / Self Care | Payer: BC Managed Care – PPO | Attending: Emergency Medicine | Admitting: Emergency Medicine

## 2013-05-29 ENCOUNTER — Encounter (HOSPITAL_COMMUNITY): Payer: Self-pay | Admitting: Emergency Medicine

## 2013-05-29 ENCOUNTER — Emergency Department (HOSPITAL_COMMUNITY): Payer: BC Managed Care – PPO

## 2013-05-29 DIAGNOSIS — F17200 Nicotine dependence, unspecified, uncomplicated: Secondary | ICD-10-CM | POA: Insufficient documentation

## 2013-05-29 DIAGNOSIS — Z872 Personal history of diseases of the skin and subcutaneous tissue: Secondary | ICD-10-CM | POA: Insufficient documentation

## 2013-05-29 DIAGNOSIS — Z3202 Encounter for pregnancy test, result negative: Secondary | ICD-10-CM | POA: Insufficient documentation

## 2013-05-29 DIAGNOSIS — Z880 Allergy status to penicillin: Secondary | ICD-10-CM | POA: Insufficient documentation

## 2013-05-29 DIAGNOSIS — K8020 Calculus of gallbladder without cholecystitis without obstruction: Secondary | ICD-10-CM | POA: Insufficient documentation

## 2013-05-29 LAB — CBC
Hemoglobin: 11.5 g/dL — ABNORMAL LOW (ref 12.0–15.0)
MCH: 22.2 pg — ABNORMAL LOW (ref 26.0–34.0)
MCV: 66.9 fL — ABNORMAL LOW (ref 78.0–100.0)
RBC: 5.19 MIL/uL — ABNORMAL HIGH (ref 3.87–5.11)
WBC: 8.9 10*3/uL (ref 4.0–10.5)

## 2013-05-29 LAB — COMPREHENSIVE METABOLIC PANEL
ALT: 10 U/L (ref 0–35)
CO2: 24 mEq/L (ref 19–32)
Calcium: 8.7 mg/dL (ref 8.4–10.5)
Chloride: 106 mEq/L (ref 96–112)
GFR calc Af Amer: >90 mL/min (ref >90)
GFR calc non Af Amer: >90 mL/min (ref >90)
Glucose, Bld: 101 mg/dL — ABNORMAL HIGH (ref 70–99)
Sodium: 139 mEq/L (ref 135–145)
Total Bilirubin: 0.1 mg/dL — ABNORMAL LOW (ref 0.3–1.2)

## 2013-05-29 MED ORDER — HYDROMORPHONE HCL PF 1 MG/ML IJ SOLN
1.0000 mg | Freq: Once | INTRAMUSCULAR | Status: AC
  Administered 2013-05-29: 1 mg via INTRAVENOUS
  Filled 2013-05-29: qty 1

## 2013-05-29 MED ORDER — ONDANSETRON HCL 4 MG/2ML IJ SOLN
4.0000 mg | Freq: Once | INTRAMUSCULAR | Status: AC
  Administered 2013-05-29: 4 mg via INTRAVENOUS
  Filled 2013-05-29: qty 2

## 2013-05-29 NOTE — ED Provider Notes (Signed)
CSN: [id number]     Arrival date & time 05/29/13  2159 History   First MD Initiated Contact with Patient 05/29/13 2207     Chief Complaint  Patient presents with  . Abdominal Pain   (Consider location/radiation/quality/duration/timing/severity/associated sxs/prior Treatment) HPI Comments: Patient is a 27 y/o female with a PMHx of gallstones diagnosed on 11/6 who presents to the ED complaining of continued RUQ abdominal pain x 1 month. She was seen in the ED on 11/6, pain worsened since. Pain described as cramping and sharp, worse after eating, has a sensation of fullness. She gets slight relief from laying on her left side in the fetal position for a short period until the right side of her back begins to hurt. Admits to nausea without vomiting. States her bowel movements have been "tiny pellets" over the past month. Denies fever or chills. Denies vaginal bleeding, discharge or urinary changes.  The history is provided by the patient.    Past Medical History  Diagnosis Date  . Recurrent boils    Past Surgical History  Procedure Laterality Date  . Bartholins cyst      had gland removed.  . Colposcopy    . Tonsillectomy    . Cervical cone biopsy     History reviewed. No pertinent family history. History  Substance Use Topics  . Smoking status: Current Every Day Smoker  . Smokeless tobacco: Not on file  . Alcohol Use: No   OB History   Grav Para Term Preterm Abortions TAB SAB Ect Mult Living                 Review of Systems  Gastrointestinal: Positive for nausea and abdominal pain.  All other systems reviewed and are negative.    Allergies  Vicodin and Penicillins cross reactors  Home Medications   Current Outpatient Rx  Name  Route  Sig  Dispense  Refill  . acetaminophen (TYLENOL) 500 MG tablet   Oral   Take 1,000 mg by mouth every 6 (six) hours as needed for pain.         Marland Kitchen ondansetron (ZOFRAN) 8 MG tablet   Oral   Take 1 tablet (8 mg total) by mouth every 8  (eight) hours as needed for nausea or vomiting.   20 tablet   0   . oxyCODONE-acetaminophen (PERCOCET/ROXICET) 5-325 MG per tablet   Oral   Take 1 tablet by mouth every 4 (four) hours as needed for severe pain.   20 tablet   0    BP 134/76  Pulse 85  Temp(Src) 98.3 F (36.8 C) (Oral)  Resp 18  Ht 5\' 4"  (1.626 m)  Wt 210 lb (95.255 kg)  BMI 36.03 kg/m2  SpO2 100%  LMP 05/21/2013 Physical Exam  Nursing note and vitals reviewed. Constitutional: She is oriented to person, place, and time. She appears well-developed and well-nourished. No distress.  HENT:  Head: Normocephalic and atraumatic.  Mouth/Throat: Oropharynx is clear and moist.  Eyes: Conjunctivae are normal.  Neck: Normal range of motion. Neck supple.  Cardiovascular: Normal rate, regular rhythm and normal heart sounds.   Pulmonary/Chest: Effort normal and breath sounds normal.  Abdominal: Soft. Normal appearance and bowel sounds are normal. There is tenderness in the right upper quadrant and epigastric area. There is guarding and positive Murphy's sign. There is no rigidity and no rebound.  No peritoneal signs.  Musculoskeletal: Normal range of motion. She exhibits no edema.  Neurological: She is alert and oriented to person,  place, and time.  Skin: Skin is warm and dry. She is not diaphoretic.  Psychiatric: She has a normal mood and affect. Her behavior is normal.    ED Course  Procedures (including critical care time) Labs Review Labs Reviewed  CBC - Abnormal; Notable for the following:    RBC 5.19 (*)    Hemoglobin 11.5 (*)    HCT 34.7 (*)    MCV 66.9 (*)    MCH 22.2 (*)    All other components within normal limits  COMPREHENSIVE METABOLIC PANEL - Abnormal; Notable for the following:    Glucose, Bld 101 (*)    Albumin 3.3 (*)    Total Bilirubin 0.1 (*)    All other components within normal limits  LIPASE, BLOOD - Abnormal; Notable for the following:    Lipase 107 (*)    All other components within  normal limits  POCT PREGNANCY, URINE   Imaging Review US Abdomen Complete  05/29/2013   CLINICAL DATA:  Abdominal pain.  EXAM: ULTRASOUND ABDOMEN COMPLETE  COMPARISON:  Abdominal ultrasound performed 05/22/2013  FINDINGS: Gallbladder  A large 2.4 cm stone is again noted dependently within the gallbladder; this is mobile in nature. No gallbladder wall thickening or pericholecystic fluid is seen. No ultrasonographic Murphy's sign is elicited.  Common bile duct  Diameter: 0.4 cm, within normal limits in caliber.  Liver  No focal lesion identified. Within normal limits in parenchymal echogenicity.  IVC  No abnormality visualized.  Pancreas  Not visualized.  Spleen  Size and appearance within normal limits.  Right Kidney  Length: 12.0 cm. Echogenicity within normal limits. No mass or hydronephrosis visualized.  Left Kidney  Length: 11.9 cm. Echogenicity within normal limits. No mass or hydronephrosis visualized.  Abdominal aorta  No aneurysm visualized.  IMPRESSION: 1. No acute abnormality seen within the abdomen. 2. Cholelithiasis again noted, with a single large mobile stone; gallbladder otherwise unremarkable in appearance.   Electronically Signed   By: Roanna Raider M.D.   On: 05/29/2013 23:46    EKG Interpretation   None       MDM   1. Gallstones    Patient with symptomatic gallstones, seen on 11/6 for the same. She is well appearing and in NAD, normal VS. No cholecystitis. Labs normal. I spoke with Dr. Abbey Chatters, surgeon on-call who states without cholecystitis it is unlikely he would operate tonight. He suggested controlling pain, if this is possible then to discharge home with outpatient follow up tomorrow. Patients pain controlled, tolerating PO, feels well enough to go home. Discharged with pain meds, zofran. Close return precautions given. Patient states understanding of treatment care plan and is agreeable. Case discussed with attending Dr. Jeraldine Loots who agrees with plan of  care.    Trevor Mace, PA-C 05/30/13 [phone]

## 2013-05-29 NOTE — ED Notes (Signed)
Pt complains of abdominal pain for one month, pt states she feels nauseated but no vomiting, states she hasn't had a BM in over one month

## 2013-05-30 MED ORDER — OXYCODONE-ACETAMINOPHEN 5-325 MG PO TABS: 1.0000 | ORAL_TABLET | Freq: Four times a day (QID) | ORAL | Status: DC | PRN

## 2013-05-30 MED ORDER — ONDANSETRON HCL 4 MG PO TABS: 4.0000 mg | ORAL_TABLET | Freq: Four times a day (QID) | ORAL | Status: DC

## 2013-05-30 NOTE — ED Provider Notes (Signed)
  Medical screening examination/treatment/procedure(s) were performed by non-physician practitioner and as supervising physician I was immediately available for consultation/collaboration.  EKG Interpretation   None          Gerhard Munch, MD 05/30/13 2359

## 2013-05-30 NOTE — ED Notes (Signed)
Pt given water and is able to tolerate it.

## 2013-06-03 ENCOUNTER — Ambulatory Visit (INDEPENDENT_AMBULATORY_CARE_PROVIDER_SITE_OTHER): Payer: BC Managed Care – PPO | Admitting: Surgery

## 2013-06-05 ENCOUNTER — Encounter (INDEPENDENT_AMBULATORY_CARE_PROVIDER_SITE_OTHER): Payer: Self-pay | Admitting: Surgery

## 2013-06-20 ENCOUNTER — Emergency Department (HOSPITAL_COMMUNITY)
Admission: EM | Admit: 2013-06-20 | Discharge: 2013-06-21 | Disposition: A | Discharge status: Home / Self Care | Payer: BC Managed Care – PPO | Attending: Dermatology | Admitting: Dermatology

## 2013-06-20 ENCOUNTER — Emergency Department (HOSPITAL_COMMUNITY): Payer: BC Managed Care – PPO

## 2013-06-20 DIAGNOSIS — M79609 Pain in unspecified limb: Secondary | ICD-10-CM | POA: Insufficient documentation

## 2013-06-20 DIAGNOSIS — Z880 Allergy status to penicillin: Secondary | ICD-10-CM | POA: Insufficient documentation

## 2013-06-20 DIAGNOSIS — Z872 Personal history of diseases of the skin and subcutaneous tissue: Secondary | ICD-10-CM | POA: Insufficient documentation

## 2013-06-20 DIAGNOSIS — F17200 Nicotine dependence, unspecified, uncomplicated: Secondary | ICD-10-CM | POA: Insufficient documentation

## 2013-06-20 DIAGNOSIS — S92911S Unspecified fracture of right toe(s), sequela: Secondary | ICD-10-CM

## 2013-06-20 DIAGNOSIS — G8911 Acute pain due to trauma: Secondary | ICD-10-CM | POA: Insufficient documentation

## 2013-06-20 DIAGNOSIS — M25569 Pain in unspecified knee: Secondary | ICD-10-CM | POA: Insufficient documentation

## 2013-06-20 MED ORDER — TRAMADOL HCL 50 MG PO TABS
50.0000 mg | ORAL_TABLET | Freq: Once | ORAL | Status: AC
  Administered 2013-06-21: 50 mg via ORAL
  Filled 2013-06-20: qty 1

## 2013-06-20 MED ORDER — TRAMADOL HCL 50 MG PO TABS: 50.0000 mg | ORAL_TABLET | Freq: Four times a day (QID) | ORAL | Status: DC | PRN

## 2013-06-20 NOTE — ED Provider Notes (Signed)
CSN: [id number]     Arrival date & time 06/20/13  2249 History   This chart was scribed for Tammy Favor, NP by Nicholos Johns, ED scribe. This patient was seen in room WTR5/WTR5 and the patient's care was started at 10:49 pm.     Chief Complaint  Patient presents with  . Toe Injury  . Knee Pain   Patient is a 27 y.o. female presenting with toe pain. The history is provided by the patient. No language interpreter was used.  Toe Pain This is a recurrent problem. The current episode started more than 1 week ago. The problem occurs constantly. The symptoms are aggravated by walking. Nothing relieves the symptoms. The treatment provided no relief.   HPI Comments: Tammy Francis is a 27 y.o. female who presents to the Emergency Department complaining of pain in R pinkie toe with associated R knee pain. Pt states she broke her toe during Thanksgiving, confirmed by x-ray, and has been using buddy tape but was recently instructed by her PCP to take off the bandage as well as discontinue icing. Pt reports difficulty ambulating and states she cannot "function in any other shoe" besides crocs. Pt has taken Tylenol and Aleve with no relief.  Past Medical History  Diagnosis Date  . Recurrent boils    Past Surgical History  Procedure Laterality Date  . Bartholins cyst      had gland removed.  . Colposcopy    . Tonsillectomy    . Cervical cone biopsy     No family history on file. History  Substance Use Topics  . Smoking status: Current Every Day Smoker  . Smokeless tobacco: Not on file  . Alcohol Use: No   OB History   Grav Para Term Preterm Abortions TAB SAB Ect Mult Living                 Review of Systems  Musculoskeletal: Positive for arthralgias. Negative for joint swelling (R Knee).       R Pinkie Toe Pain  Skin: Negative for color change.  All other systems reviewed and are negative.  A complete 10 system review of systems was obtained and all systems are negative except as  noted in the HPI and PMH.     Allergies  Vicodin and Penicillins cross reactors  Home Medications   Current Outpatient Rx  Name  Route  Sig  Dispense  Refill  . acetaminophen (TYLENOL) 500 MG tablet   Oral   Take 1,000 mg by mouth every 6 (six) hours as needed for pain.         Marland Kitchen ondansetron (ZOFRAN) 4 MG tablet   Oral   Take 1 tablet (4 mg total) by mouth every 6 (six) hours.   12 tablet   0   . ondansetron (ZOFRAN) 8 MG tablet   Oral   Take 1 tablet (8 mg total) by mouth every 8 (eight) hours as needed for nausea or vomiting.   20 tablet   0   . oxyCODONE-acetaminophen (PERCOCET) 5-325 MG per tablet   Oral   Take 1-2 tablets by mouth every 6 (six) hours as needed for severe pain.   10 tablet   0   . oxyCODONE-acetaminophen (PERCOCET/ROXICET) 5-325 MG per tablet   Oral   Take 1 tablet by mouth every 4 (four) hours as needed for severe pain.   20 tablet   0   . traMADol (ULTRAM) 50 MG tablet   Oral  Take 1 tablet (50 mg total) by mouth every 6 (six) hours as needed.   30 tablet   0    Triage Vitals: BP 129/83  Pulse 81  Temp(Src) 98.5 F (36.9 C) (Oral)  Resp 16  SpO2 99%  LMP 05/21/2013 Physical Exam  Nursing note and vitals reviewed. Constitutional: She is oriented to person, place, and time. She appears well-developed and well-nourished.  HENT:  Head: Normocephalic and atraumatic.  Eyes: Conjunctivae are normal.  Neck: Normal range of motion.  Cardiovascular: Normal rate and regular rhythm.   Pulmonary/Chest: Effort normal. No respiratory distress.  Musculoskeletal: Normal range of motion.  Neurological: She is alert and oriented to person, place, and time.  Skin: Skin is warm and dry.  Psychiatric: She has a normal mood and affect. Her behavior is normal.    ED Course  Procedures (including critical care time) DIAGNOSTIC STUDIES: Oxygen Saturation is 99% on room air, normal by my interpretation.    COORDINATION OF CARE: At 11:20  Discussed treatment plan with patient which includes R Foot x-ray. Patient agrees.   Labs Review Labs Reviewed - No data to display Imaging Review Dg Foot Complete Right  06/20/2013   CLINICAL DATA:  Pain along the lateral 4th and 5th metatarsal bones.  EXAM: RIGHT FOOT COMPLETE - 3+ VIEW  COMPARISON:  04/04/2011  FINDINGS: Three views of the right foot were obtained. There is a nondisplaced fracture involving the 5th toe middle phalanx. Please note that the 5th toe middle and distal phalanx appear to be fused. No other definite fractures.  IMPRESSION: Nondisplaced fracture of the 5th toe.   Electronically Signed   By: Richarda Overlie M.D.   On: 06/20/2013 23:50    EKG Interpretation   None       MDM   1. Toe fracture, right, sequela    patient I skin buddy taped, and placed in a flat bottom shoe for comfort.  She began a prescription for Ultram I personally performed the services described in this documentation, which was scribed in my presence. The recorded information has been reviewed and is accurate.   Arman Filter, NP 06/21/13 0002  Arman Filter, NP 06/21/13 0004

## 2013-06-20 NOTE — ED Notes (Signed)
Pt states she broke her R pinkie toe on Thanksgiving. Pt has buddy taped toe, but states pain is worse and now radiating up to her R knee. Pt has taken Tylenol and Aleve, but it is not helping much. Pt states now it feels like her R knee is swollen. Knee is painful. Pt ambulatory to exam room with steady gait.

## 2013-06-22 NOTE — ED Provider Notes (Signed)
Pt of MD Campos/NP Pricilla Larsson, MD 06/22/13 [phone]

## 2014-03-22 ENCOUNTER — Emergency Department: Payer: Self-pay | Admitting: Emergency Medicine

## 2014-03-22 LAB — COMPREHENSIVE METABOLIC PANEL
ALBUMIN: 3.8 g/dL (ref 3.4–5.0)
ALK PHOS: 89 U/L
ANION GAP: 6 — AB (ref 7–16)
BUN: 8 mg/dL (ref 7–18)
Bilirubin,Total: 0.2 mg/dL (ref 0.2–1.0)
CHLORIDE: 107 mmol/L (ref 98–107)
CO2: 28 mmol/L (ref 21–32)
CREATININE: 0.92 mg/dL (ref 0.60–1.30)
Calcium, Total: 8.2 mg/dL — ABNORMAL LOW (ref 8.5–10.1)
Glucose: 75 mg/dL (ref 65–99)
OSMOLALITY: 278 (ref 275–301)
POTASSIUM: 3.5 mmol/L (ref 3.5–5.1)
SGOT(AST): 26 U/L (ref 15–37)
SGPT (ALT): 17 U/L
Sodium: 141 mmol/L (ref 136–145)
Total Protein: 7.7 g/dL (ref 6.4–8.2)

## 2014-03-22 LAB — CBC WITH DIFFERENTIAL/PLATELET
BASOS ABS: 0 10*3/uL (ref 0.0–0.1)
Basophil %: 0.4 %
EOS PCT: 1 %
Eosinophil #: 0.1 10*3/uL (ref 0.0–0.7)
HCT: 39.4 % (ref 35.0–47.0)
HGB: 12.3 g/dL (ref 12.0–16.0)
Lymphocyte #: 2.9 10*3/uL (ref 1.0–3.6)
Lymphocyte %: 31.5 %
MCH: 22.1 pg — AB (ref 26.0–34.0)
MCHC: 31.2 g/dL — ABNORMAL LOW (ref 32.0–36.0)
MCV: 71 fL — AB (ref 80–100)
MONO ABS: 0.5 x10 3/mm (ref 0.2–0.9)
MONOS PCT: 5.4 %
Neutrophil #: 5.7 10*3/uL (ref 1.4–6.5)
Neutrophil %: 61.7 %
PLATELETS: 295 10*3/uL (ref 150–440)
RBC: 5.57 10*6/uL — ABNORMAL HIGH (ref 3.80–5.20)
RDW: 16.2 % — AB (ref 11.5–14.5)
WBC: 9.3 10*3/uL (ref 3.6–11.0)

## 2014-03-22 LAB — URINALYSIS, COMPLETE
BACTERIA: NONE SEEN
Bilirubin,UR: NEGATIVE
Blood: NEGATIVE
Glucose,UR: NEGATIVE mg/dL (ref 0–75)
Ketone: NEGATIVE
Leukocyte Esterase: NEGATIVE
NITRITE: NEGATIVE
Ph: 6 (ref 4.5–8.0)
Protein: NEGATIVE
SPECIFIC GRAVITY: 1.012 (ref 1.003–1.030)
Squamous Epithelial: 1

## 2014-03-22 LAB — TROPONIN I

## 2014-03-22 LAB — LIPASE, BLOOD: Lipase: 447 U/L — ABNORMAL HIGH (ref 73–393)

## 2014-06-14 ENCOUNTER — Emergency Department: Payer: Self-pay | Admitting: Emergency Medicine

## 2014-06-14 LAB — CBC WITH DIFFERENTIAL/PLATELET
Basophil #: 0 10*3/uL (ref 0.0–0.1)
Basophil %: 0.3 %
EOS ABS: 0.1 10*3/uL (ref 0.0–0.7)
Eosinophil %: 0.7 %
HCT: 38.7 % (ref 35.0–47.0)
HGB: 12.2 g/dL (ref 12.0–16.0)
LYMPHS PCT: 33 %
Lymphocyte #: 2.7 10*3/uL (ref 1.0–3.6)
MCH: 21.9 pg — AB (ref 26.0–34.0)
MCHC: 31.5 g/dL — ABNORMAL LOW (ref 32.0–36.0)
MCV: 70 fL — AB (ref 80–100)
MONO ABS: 0.5 x10 3/mm (ref 0.2–0.9)
Monocyte %: 6.2 %
NEUTROS ABS: 4.9 10*3/uL (ref 1.4–6.5)
Neutrophil %: 59.8 %
Platelet: 292 10*3/uL (ref 150–440)
RBC: 5.56 10*6/uL — AB (ref 3.80–5.20)
RDW: 16 % — ABNORMAL HIGH (ref 11.5–14.5)
WBC: 8.2 10*3/uL (ref 3.6–11.0)

## 2014-06-14 LAB — URINALYSIS, COMPLETE
Bilirubin,UR: NEGATIVE
GLUCOSE, UR: NEGATIVE mg/dL (ref 0–75)
LEUKOCYTE ESTERASE: NEGATIVE
Nitrite: NEGATIVE
Ph: 6 (ref 4.5–8.0)
Protein: 30
RBC,UR: 239 /HPF (ref 0–5)
Specific Gravity: 1.027 (ref 1.003–1.030)
Squamous Epithelial: 14

## 2014-06-14 LAB — LIPASE, BLOOD: LIPASE: 206 U/L (ref 73–393)

## 2014-06-14 LAB — COMPREHENSIVE METABOLIC PANEL
ALT: 15 U/L
ANION GAP: 8 (ref 7–16)
Albumin: 3.6 g/dL (ref 3.4–5.0)
Alkaline Phosphatase: 89 U/L
BILIRUBIN TOTAL: 0.5 mg/dL (ref 0.2–1.0)
BUN: 8 mg/dL (ref 7–18)
CREATININE: 0.91 mg/dL (ref 0.60–1.30)
Calcium, Total: 8.5 mg/dL (ref 8.5–10.1)
Chloride: 106 mmol/L (ref 98–107)
Co2: 26 mmol/L (ref 21–32)
Glucose: 87 mg/dL (ref 65–99)
OSMOLALITY: 277 (ref 275–301)
Potassium: 3.6 mmol/L (ref 3.5–5.1)
SGOT(AST): 22 U/L (ref 15–37)
Sodium: 140 mmol/L (ref 136–145)
TOTAL PROTEIN: 7.5 g/dL (ref 6.4–8.2)

## 2014-06-14 LAB — TROPONIN I: Troponin-I: <0.02 ng/mL

## 2014-06-15 LAB — TROPONIN I

## 2014-09-07 ENCOUNTER — Ambulatory Visit (INDEPENDENT_AMBULATORY_CARE_PROVIDER_SITE_OTHER): Payer: Medicaid Other | Admitting: General Surgery

## 2014-09-07 ENCOUNTER — Telehealth: Payer: Self-pay | Admitting: *Deleted

## 2014-09-07 ENCOUNTER — Encounter: Payer: Self-pay | Admitting: General Surgery

## 2014-09-07 ENCOUNTER — Encounter: Payer: Self-pay | Admitting: *Deleted

## 2014-09-07 ENCOUNTER — Other Ambulatory Visit: Payer: Self-pay | Admitting: General Surgery

## 2014-09-07 VITALS — BP 120/76 | HR 78 | Resp 12 | Ht 64.0 in | Wt 200.0 lb

## 2014-09-07 DIAGNOSIS — K8020 Calculus of gallbladder without cholecystitis without obstruction: Secondary | ICD-10-CM

## 2014-09-07 DIAGNOSIS — R1011 Right upper quadrant pain: Secondary | ICD-10-CM

## 2014-09-07 NOTE — Telephone Encounter (Signed)
Patient notified labs drawn today were normal.   This patient's surgery has been scheduled for 09-10-14 at [facility].

## 2014-09-07 NOTE — Patient Instructions (Addendum)

## 2014-09-07 NOTE — Progress Notes (Addendum)
Patient ID: Donell BeersLatonya Marie Rendall, female   DOB: 04-15-1986, 29 y.o.   MRN: [id number]  Chief Complaint  Patient presents with  . Other    gallstones    HPI Tammy Francis is a 29 y.o. female here today for evaluation of gallstones. Patient states she has been having abdominal pain in her right upper quadrant and radiation  to her back  For about  four months.  She states the pain is worst aftr she eats and lasts for hours.  Fatty /spicy foods cause the pain most ofetn. She had an abdominal ultrasound done on 08/27/14. HPI  Past Medical History  Diagnosis Date  . Recurrent boils     Past Surgical History  Procedure Laterality Date  . Bartholins cyst      had gland removed.  . Colposcopy    . Tonsillectomy    . Cervical cone biopsy    . Cesarean section  2010    History reviewed. No pertinent family history.  Social History History  Substance Use Topics  . Smoking status: Current Every Day Smoker -- 1.00 packs/day for 7 years    Types: Cigarettes  . Smokeless tobacco: Not on file  . Alcohol Use: 0.0 oz/week    0 Standard drinks or equivalent per week    Allergies  Allergen Reactions  . Penicillins Itching  . Vicodin [Hydrocodone-Acetaminophen] Itching  . Penicillins Cross Reactors Rash    Current Outpatient Prescriptions  Medication Sig Dispense Refill  . azithromycin (ZITHROMAX) 250 MG tablet Take by mouth daily.    . fluticasone (FLOVENT DISKUS) 50 MCG/BLIST diskus inhaler Inhale 1 puff into the lungs 2 (two) times daily.    . promethazine (PHENERGAN) 6.25 MG/5ML syrup Take by mouth every 6 (six) hours as needed for nausea or vomiting.     No current facility-administered medications for this visit.    Review of Systems Review of Systems  Constitutional: Negative.   Respiratory: Negative.   Cardiovascular: Negative.   Gastrointestinal: Positive for nausea, vomiting, abdominal pain, diarrhea and constipation. Negative for blood in stool, abdominal  distention, anal bleeding and rectal pain.    Blood pressure 120/76, pulse 78, resp. rate 12, height 5\' 4"  (1.626 m), weight 200 lb (90.719 kg), last menstrual period 08/31/2014.  Physical Exam Physical Exam  Constitutional: She is oriented to person, place, and time. She appears well-developed and well-nourished.  Eyes: Conjunctivae are normal. No scleral icterus.  Neck: Neck supple.  Cardiovascular: Normal rate, regular rhythm and normal heart sounds.   Pulmonary/Chest: Effort normal and breath sounds normal.  Abdominal: Soft. Normal appearance and bowel sounds are normal. There is no hepatomegaly. There is tenderness ( mild Epigastric tenderness) in the epigastric area.  Lymphadenopathy:    She has no cervical adenopathy.  Neurological: She is alert and oriented to person, place, and time.  Skin: Skin is warm and dry.    Data Reviewed Abdominal ultrasound reviewed-multiple gallstones. No gbw thickening, no pericholecystic fluid LFTS and CBC were normal. Lipase as not done Assessment    Cholelithiasis, chronic cholecystitis. Pt has moderate epigastric tenderness.     Plan   Check lipase, if normal will schedule laparoscopy cholecystectomy.     Discussed gallbladder removal , patient agrees.Procedure, risks and benefits discussed with pt.    Lipase is normal. Will schedule cholecystectomy   SANKAR,SEEPLAPUTHUR G 09/07/2014, 10:20 AM

## 2014-09-07 NOTE — Addendum Note (Signed)
Addended by: Kieth BrightlySANKAR, SEEPLAPUTHUR G on: 09/07/2014 12:18 PM   Modules accepted: Orders

## 2014-09-10 ENCOUNTER — Encounter (HOSPITAL_BASED_OUTPATIENT_CLINIC_OR_DEPARTMENT_OTHER): Payer: Medicaid Other | Admitting: General Surgery

## 2014-09-10 ENCOUNTER — Ambulatory Visit: Payer: Self-pay | Admitting: General Surgery

## 2014-09-10 DIAGNOSIS — K8010 Calculus of gallbladder with chronic cholecystitis without obstruction: Secondary | ICD-10-CM

## 2014-09-11 ENCOUNTER — Encounter: Payer: Self-pay | Admitting: General Surgery

## 2014-09-12 ENCOUNTER — Telehealth: Payer: Self-pay | Admitting: General Surgery

## 2014-09-12 NOTE — Telephone Encounter (Signed)
Patient called reporting constant pain since lap chole/ grams completed 2 days ago. Small emesis yesterday and today.  Using percocet 5/325; 2 po q4h as well as prn ibuprofen. Had not used heat as of yet.  If heating pad does not bring significant relief, she was encouraged to go to ED for evaluation.

## 2014-09-13 ENCOUNTER — Observation Stay: Payer: Self-pay | Admitting: General Surgery

## 2014-09-14 ENCOUNTER — Encounter: Payer: Self-pay | Admitting: General Surgery

## 2014-09-16 ENCOUNTER — Encounter: Payer: Self-pay | Admitting: General Surgery

## 2014-09-21 ENCOUNTER — Ambulatory Visit (INDEPENDENT_AMBULATORY_CARE_PROVIDER_SITE_OTHER): Payer: Medicaid Other | Admitting: General Surgery

## 2014-09-21 ENCOUNTER — Encounter: Payer: Self-pay | Admitting: General Surgery

## 2014-09-21 VITALS — BP 120/70 | HR 74 | Resp 12 | Ht 64.0 in

## 2014-09-21 DIAGNOSIS — K8020 Calculus of gallbladder without cholecystitis without obstruction: Secondary | ICD-10-CM

## 2014-09-21 NOTE — Patient Instructions (Signed)
Patient to return as needed. 

## 2014-09-21 NOTE — Progress Notes (Signed)
This is a 29 year old female here today for her post op gallbladder surgery done on 09/10/14. She had ro be admitted for pain control 2 days after. W/U showed no complications. Since then she has been doing much bette. Port site are clean and healing well.  Abdomen is soft, non tender. May resume normal activity. Path showed chronic cholecystitis and cholelithiasis

## 2014-11-09 LAB — SURGICAL PATHOLOGY

## 2014-11-15 NOTE — Op Note (Signed)
PATIENT NAME:  Tammy Francis, [name] MR#:  [id number] DATE OF BIRTH:  05-17-1986  DATE OF PROCEDURE:  09/10/2014  PREOPERATIVE DIAGNOSIS: Cholelithiasis with chronic cholecystitis.   POSTOPERATIVE DIAGNOSIS: Cholelithiasis with chronic cholecystitis.   OPERATION: Laparoscopy and cholecystectomy with intraoperative cholangiogram.   SURGEON: Wynona LunaG. Deforrest Bogle, M.D.   ANESTHESIA: General.   COMPLICATIONS: None.   ESTIMATED BLOOD LOSS:  Approximately 75 mL.   DRAINS: None.   DESCRIPTION OF PROCEDURE: The patient was put to sleep in the supine position on the operating table. The abdomen was prepped and draped out as a sterile field. A small incision was made at the umbilicus and a Veress needle with the InnerDyne sleeve was positioned in the peritoneal cavity after the fascia was lifted up. Position verified with the hanging drop method and pneumoperitoneum obtained. A 10 mm port was placed and subsequently epigastric and two lateral 5 mm ports were placed. The gallbladder showed evidence of chronic cholecystitis and a few adhesions were taken down satisfactorily and also contained what looked like a large stone about 3 cm in size. This was freely mobile, this was milked up. The Hartmann's pouch area was then dissected. The cystic duct and the cystic artery was easily identified; both of these were freed. The cystic artery was hemoclipped and cut. Cholangiogram was then performed with a Kumar clamp and catheter. The bile duct appeared to be mildly dilated, but did not show an actual filling defect. The catheter was removed. The cystic duct was hemoclipped, the gallbladder was then dissected free from its bed using cautery and it was noted there was some multiple tiny arterial bleeding from the gallbladder bed which required careful cauterization and dissection. After the gallbladder was removed the gallbladder bed was inspected. This clips were noted to be in place, there was still some oozing from the gallbladder  bed and cautery was used to control all of this.  In order  to minimize further oozing this area was covered with 5 mL of Surgiflo containing thrombin.  Fluid was used to irrigate out the right upper quadrant and suctioned out. The gallbladder was then brought out through the umbilical port site using a retrieval bag.  It was noted to contain a large 3 cm ovoid stone.  The fascial opening had to be enlarged slightly in order to allow complete removal of the gallbladder. Following this, pneumoperitoneum was released and the ports were removed. The fascial opening in the umbilicus was closed with 2 stitches of 0 Vicryl. The skin incision was closed with subcuticular 4-0 Vicryl reinforced with Steri-Strips and tincture of benzoin, dry sterile dressing placed.   The patient tolerated the procedure well. She was subsequently extubated and returned to the recovery room in stable condition.    ____________________________ S.Wynona LunaG. Ranita Stjulien, MD sgs:at D: 09/11/2014 09:01:57 ET T: 09/11/2014 19:37:33 ET JOB#: [id number]  cc: Timoteo ExposeS.G. Evette CristalSankar, MD, <Dictator> [facility] Wynona LunaG Donaldson Richter MD ELECTRONICALLY SIGNED 09/15/2014 11:06

## 2014-12-22 ENCOUNTER — Ambulatory Visit: Payer: Medicaid Other | Admitting: General Surgery

## 2014-12-30 ENCOUNTER — Encounter: Payer: Self-pay | Admitting: *Deleted

## 2015-11-30 ENCOUNTER — Encounter: Payer: Self-pay | Admitting: Emergency Medicine

## 2015-11-30 ENCOUNTER — Emergency Department: Payer: Medicaid Other

## 2015-11-30 ENCOUNTER — Emergency Department
Admission: EM | Admit: 2015-11-30 | Discharge: 2015-12-01 | Disposition: A | Discharge status: Home / Self Care | Payer: Medicaid Other | Attending: Student | Admitting: Student

## 2015-11-30 DIAGNOSIS — Z3A01 Less than 8 weeks gestation of pregnancy: Secondary | ICD-10-CM | POA: Insufficient documentation

## 2015-11-30 DIAGNOSIS — R1084 Generalized abdominal pain: Secondary | ICD-10-CM | POA: Diagnosis not present

## 2015-11-30 DIAGNOSIS — F17210 Nicotine dependence, cigarettes, uncomplicated: Secondary | ICD-10-CM | POA: Insufficient documentation

## 2015-11-30 DIAGNOSIS — O99611 Diseases of the digestive system complicating pregnancy, first trimester: Secondary | ICD-10-CM | POA: Insufficient documentation

## 2015-11-30 DIAGNOSIS — R112 Nausea with vomiting, unspecified: Secondary | ICD-10-CM | POA: Diagnosis present

## 2015-11-30 DIAGNOSIS — R52 Pain, unspecified: Secondary | ICD-10-CM

## 2015-11-30 DIAGNOSIS — R1111 Vomiting without nausea: Secondary | ICD-10-CM

## 2015-11-30 LAB — COMPREHENSIVE METABOLIC PANEL
ALBUMIN: 4.2 g/dL (ref 3.5–5.0)
ALT: 18 U/L (ref 14–54)
AST: 22 U/L (ref 15–41)
Alkaline Phosphatase: 64 U/L (ref 38–126)
Anion gap: 6 (ref 5–15)
BUN: 8 mg/dL (ref 6–20)
CHLORIDE: 107 mmol/L (ref 101–111)
CO2: 24 mmol/L (ref 22–32)
CREATININE: 0.79 mg/dL (ref 0.44–1.00)
Calcium: 9 mg/dL (ref 8.9–10.3)
GFR calc Af Amer: >60 mL/min (ref >60)
GLUCOSE: 91 mg/dL (ref 65–99)
POTASSIUM: 3.3 mmol/L — AB (ref 3.5–5.1)
SODIUM: 137 mmol/L (ref 135–145)
Total Bilirubin: 0.3 mg/dL (ref 0.3–1.2)
Total Protein: 7.1 g/dL (ref 6.5–8.1)

## 2015-11-30 LAB — URINALYSIS COMPLETE WITH MICROSCOPIC (ARMC ONLY)
BACTERIA UA: NONE SEEN
BILIRUBIN URINE: NEGATIVE
Glucose, UA: NEGATIVE mg/dL
Hgb urine dipstick: NEGATIVE
Ketones, ur: NEGATIVE mg/dL
Nitrite: NEGATIVE
PH: 6 (ref 5.0–8.0)
Protein, ur: NEGATIVE mg/dL
Specific Gravity, Urine: 1.009 (ref 1.005–1.030)

## 2015-11-30 LAB — CBC
HCT: 34.9 % — ABNORMAL LOW (ref 35.0–47.0)
Hemoglobin: 11.1 g/dL — ABNORMAL LOW (ref 12.0–16.0)
MCH: 21.8 pg — ABNORMAL LOW (ref 26.0–34.0)
MCHC: 31.8 g/dL — ABNORMAL LOW (ref 32.0–36.0)
MCV: 68.5 fL — AB (ref 80.0–100.0)
PLATELETS: 273 10*3/uL (ref 150–440)
RBC: 5.09 MIL/uL (ref 3.80–5.20)
RDW: 16.7 % — ABNORMAL HIGH (ref 11.5–14.5)
WBC: 7.8 10*3/uL (ref 3.6–11.0)

## 2015-11-30 LAB — HCG, QUANTITATIVE, PREGNANCY: HCG, BETA CHAIN, QUANT, S: 66804 m[IU]/mL — AB (ref <5)

## 2015-11-30 LAB — ABO/RH: ABO/RH(D): O POS

## 2015-11-30 LAB — LIPASE, BLOOD: Lipase: 62 U/L — ABNORMAL HIGH (ref 11–51)

## 2015-11-30 MED ORDER — ONDANSETRON HCL 4 MG/2ML IJ SOLN
4.0000 mg | Freq: Once | INTRAMUSCULAR | Status: AC
  Administered 2015-11-30: 4 mg via INTRAVENOUS
  Filled 2015-11-30: qty 2

## 2015-11-30 MED ORDER — SODIUM CHLORIDE 0.9 % IV BOLUS (SEPSIS)
1000.0000 mL | Freq: Once | INTRAVENOUS | Status: AC
  Administered 2015-11-30: 1000 mL via INTRAVENOUS

## 2015-11-30 NOTE — ED Provider Notes (Signed)
[facility] Emergency Department Provider Note   ____________________________________________  Time seen: Approximately 9:41 PM  I have reviewed the triage vital signs and the nursing notes.   HISTORY  Chief Complaint Abdominal Pain    HPI Tammy Francis is a 30 y.o. female with no chronic medical problems, G2 P1 at possibly 3 months gestational age who presents for evaluation of 3 episodes of emesis this evening, gradual onset, constant as well as diffuse abdominal pain. The patient reports that she ate eggrolls at approximately 6 PM. At approximately 8 PM she had 3 episodes of emesis. The emesis was nonbilious, she reports the third time that she vomited she saw a small amount of blood in the vomit and that concerned her enough to bring her to the emergency department. She is complaining of right upper quadrant abdominal pain, she has also had some right lower quadrant pain but she reports that she has had the right lower quadrant pain on and off for 3 months and that was the pain that initially prompted her to get a pregnancy test. She reports that it improves when she finds a comfortable position in bed. She has had no diarrhea, no fevers or chills. She reports that 2 days ago she had some light spotting from her vagina but no spotting/bleeding since then. She denies any other abnormal vaginal discharge. She has not felt fetal movement yet this pregnancy. No chest pain or difficulty breathing. No known sick contacts. She does not know when her last normal menstrual period was, she is followed by the health Department for this pregnant.   Past Medical History  Diagnosis Date  . Recurrent boils     There are no active problems to display for this patient.   Past Surgical History  Procedure Laterality Date  . Bartholins cyst      had gland removed.  . Colposcopy    . Tonsillectomy    . Cervical cone biopsy    . Cesarean section  2010  .  Cholecystectomy    . Adenoidectomy      Current Outpatient Rx  Name  Route  Sig  Dispense  Refill  . azithromycin (ZITHROMAX) 250 MG tablet   Oral   Take by mouth daily.         . fluticasone (FLOVENT DISKUS) 50 MCG/BLIST diskus inhaler   Inhalation   Inhale 1 puff into the lungs 2 (two) times daily.         . promethazine (PHENERGAN) 6.25 MG/5ML syrup   Oral   Take by mouth every 6 (six) hours as needed for nausea or vomiting.           Allergies Penicillins; Vicodin; and Penicillins cross reactors  No family history on file.  Social History Social History  Substance Use Topics  . Smoking status: Current Every Day Smoker -- 1.00 packs/day for 7 years    Types: Cigarettes  . Smokeless tobacco: None  . Alcohol Use: 0.0 oz/week    0 Standard drinks or equivalent per week    Review of Systems Constitutional: No fever/chills Eyes: No visual changes. ENT: No sore throat. Cardiovascular: Denies chest pain. Respiratory: Denies shortness of breath. Gastrointestinal: + abdominal pain.  No nausea, + vomiting.  No diarrhea.  No constipation. Genitourinary: Negative for dysuria. Musculoskeletal: Negative for back pain. Skin: Negative for rash. Neurological: Negative for headaches, focal weakness or numbness.  10-point ROS otherwise negative.  ____________________________________________   PHYSICAL EXAM:  VITAL SIGNS:  ED Triage Vitals  Enc Vitals Group     BP 11/30/15 2044 127/63 mmHg     Pulse Rate 11/30/15 2044 72     Resp 11/30/15 2044 18     Temp 11/30/15 2044 98.2 F (36.8 C)     Temp Source 11/30/15 2044 Oral     SpO2 11/30/15 2044 100 %     Weight 11/30/15 2044 200 lb (90.719 kg)     Height 11/30/15 2044 5\' 4"  (1.626 m)     Head Cir --      Peak Flow --      Pain Score 11/30/15 2041 9     Pain Loc --      Pain Edu? --      Excl. in GC? --     Constitutional: Alert and oriented. Well appearing and in no acute distress. Eyes: Conjunctivae are  normal. PERRL. EOMI. Head: Atraumatic. Nose: No congestion/rhinnorhea. Mouth/Throat: Mucous membranes are moist.  Oropharynx non-erythematous. Neck: No stridor.  Supple without meningismus. Cardiovascular: Normal rate, regular rhythm. Grossly normal heart sounds.  Good peripheral circulation. Respiratory: Normal respiratory effort.  No retractions. Lungs CTAB. Gastrointestinal: With mild diffuse tenderness, no rebound, no guarding, no rigidity, gravid uterus. No CVA tenderness. Genitourinary: deferred Musculoskeletal: No lower extremity tenderness nor edema.  No joint effusions. Neurologic:  Normal speech and language. No gross focal neurologic deficits are appreciated. No gait instability. Skin:  Skin is warm, dry and intact. No rash noted. Psychiatric: Mood and affect are normal. Speech and behavior are normal.  ____________________________________________   LABS (all labs ordered are listed, but only abnormal results are displayed)  Labs Reviewed  URINALYSIS COMPLETEWITH MICROSCOPIC ([facility] ONLY) - Abnormal; Notable for the following:    Color, Urine YELLOW (*)    APPearance CLEAR (*)    Leukocytes, UA 1+ (*)    Squamous Epithelial / LPF 0-5 (*)    All other components within normal limits  HCG, QUANTITATIVE, PREGNANCY - Abnormal; Notable for the following:    hCG, Beta Chain, Tammy Francis [id number] (*)    All other components within normal limits  CBC - Abnormal; Notable for the following:    Hemoglobin 11.1 (*)    HCT 34.9 (*)    MCV 68.5 (*)    MCH 21.8 (*)    MCHC 31.8 (*)    RDW 16.7 (*)    All other components within normal limits  COMPREHENSIVE METABOLIC PANEL - Abnormal; Notable for the following:    Potassium 3.3 (*)    All other components within normal limits  LIPASE, BLOOD - Abnormal; Notable for the following:    Lipase 62 (*)    All other components within normal limits  ABO/RH    ____________________________________________  EKG  none ____________________________________________  RADIOLOGY  RUQ ultrasound - pending  Transvaginal ultrasound with ovarian arterial dopplers - pending ____________________________________________   PROCEDURES  Procedure(s) performed: None  Critical Care performed: No  ____________________________________________   INITIAL IMPRESSION / ASSESSMENT AND PLAN / ED COURSE  Pertinent labs & imaging results that were available during my care of the patient were reviewed by me and considered in my medical decision making (see chart for details).  Tammy Francis is a 30 y.o. female with no chronic medical problems, G2 P1 at possibly 3 months gestational age who presents for evaluation of 3 episodes of emesis this evening, gradual onset, constant as well as diffuse abdominal pain. On exam, she is generally well-appearing and in no acute distress,  vital signs are stable, she is afebrile. She has very mild diffuse abdominal tenderness. Beta hCG is elevated at greater than 66,000. CBC is notable for mild anemia, hemoglobin 11.1. No leukocytosis. CMP generally unremarkable. Lipase is mildly elevated at 62, this could represent pancreatitis or lipase elevation could be reactive in the setting of her vomiting this evening. We'll treat her symptomatically. Give IV fluids as well as antiemetics. Ultrasound of the right upper quadrant as well as transvaginal ultrasound/ultrasound with ovarian Dopplers pending. Care transferred to Dr. Zenda Alpers at 11:15 PM. O+ blood type, no need for RhoGAM. ____________________________________________   FINAL CLINICAL IMPRESSION(S) / ED DIAGNOSES  Final diagnoses:  Pain  Generalized abdominal pain  Non-intractable vomiting without nausea, vomiting of unspecified type      NEW MEDICATIONS STARTED DURING THIS VISIT:  New Prescriptions   No medications on file     Note:  This document was  prepared using Dragon voice recognition software and may include unintentional dictation errors.    Tammy Doss, MD 11/30/15 [phone]

## 2015-11-30 NOTE — ED Notes (Addendum)
Pt to triage via w/c with no distress noted; st approx 3mos pregnant, G2P1, pt at [facility]; st vomiting this evening with blood noted and right sided lower abd/back pain; st vag spotting x 2wks

## 2015-12-01 MED ORDER — METOCLOPRAMIDE HCL 10 MG PO TABS: 10.0000 mg | ORAL_TABLET | Freq: Four times a day (QID) | ORAL | Status: DC | PRN

## 2015-12-01 MED ORDER — ACETAMINOPHEN 325 MG PO TABS
ORAL_TABLET | ORAL | Status: AC
  Administered 2015-12-01: 03:00:00
  Filled 2015-12-01: qty 1

## 2015-12-01 MED ORDER — ACETAMINOPHEN 325 MG PO TABS
ORAL_TABLET | ORAL | Status: AC
  Administered 2015-12-01: 03:00:00
  Filled 2015-12-01: qty 2

## 2015-12-01 NOTE — ED Provider Notes (Signed)
-----------------------------------------   12:20 AM on 12/01/2015 -----------------------------------------   Blood pressure 113/64, pulse 84, temperature 98.2 F (36.8 C), temperature source Oral, resp. rate 16, height 5\' 4"  (1.626 m), weight 200 lb (90.719 kg), last menstrual period 09/15/2015, SpO2 98 %.  Assuming care from Dr. Inocencio HomesGayle.  In short, Tammy Francis is a 30 y.o. female with a chief complaint of Abdominal Pain .  Refer to the original H&P for additional details.  The current plan of care is to follow up the results of the ultrasound and disposition the patient.   Pelvis ultrasound: Single live intrauterine gestation measured at 6 weeks 2 days by crown-rump length, nonvisualization of the left ovary, otherwise normal exam.  I did return into the room the patient was asleep without any difficulty. I explained to her the results of the ultrasound and that some of her vomiting is likely due to morning sickness. As far as the pain is not explained by the ultrasound but she is comfortable and sleeping at this time. I cleansed the patient that she should follow-up with the OB/GYN for further evaluation of her new pregnancy as well as her abdominal pain. The patient understands and agrees with the plan as stated. She has no complaints or concerns at this time. She'll be discharged home.  Rebecka ApleyAllison P Webster, MD 12/01/15 [phone]

## 2015-12-01 NOTE — Discharge Instructions (Signed)
Abdominal Pain, Adult °Many things can cause abdominal pain. Usually, abdominal pain is not caused by a disease and will improve without treatment. It can often be observed and treated at home. Your health care provider will do a physical exam and possibly order blood tests and X-rays to help determine the seriousness of your pain. However, in many cases, more time must pass before a clear cause of the pain can be found. Before that point, your health care provider may not know if you need more testing or further treatment. °HOME CARE INSTRUCTIONS °Monitor your abdominal pain for any changes. The following actions may help to alleviate any discomfort you are experiencing: °· Only take over-the-counter or prescription medicines as directed by your health care provider. °· Do not take laxatives unless directed to do so by your health care provider. °· Try a clear liquid diet (broth, tea, or water) as directed by your health care provider. Slowly move to a bland diet as tolerated. °SEEK MEDICAL CARE IF: °· You have unexplained abdominal pain. °· You have abdominal pain associated with nausea or diarrhea. °· You have pain when you urinate or have a bowel movement. °· You experience abdominal pain that wakes you in the night. °· You have abdominal pain that is worsened or improved by eating food. °· You have abdominal pain that is worsened with eating fatty foods. °· You have a fever. °SEEK IMMEDIATE MEDICAL CARE IF: °· Your pain does not go away within 2 hours. °· You keep throwing up (vomiting). °· Your pain is felt only in portions of the abdomen, such as the right side or the left lower portion of the abdomen. °· You pass bloody or black tarry stools. °MAKE SURE YOU: °· Understand these instructions. °· Will watch your condition. °· Will get help right away if you are not doing well or get worse. °  °This information is not intended to replace advice given to you by your health care provider. Make sure you discuss  any questions you have with your health care provider. °  °Document Released: 04/12/2005 Document Revised: 03/24/2015 Document Reviewed: 03/12/2013 °Elsevier Interactive Patient Education ©2016 [employer] ° °Nausea and Vomiting °Nausea is a sick feeling that often comes before throwing up (vomiting). Vomiting is a reflex where stomach contents come out of your mouth. Vomiting can cause severe loss of body fluids (dehydration). Children and elderly adults can become dehydrated quickly, especially if they also have diarrhea. Nausea and vomiting are symptoms of a condition or disease. It is important to find the cause of your symptoms. °CAUSES  °· Direct irritation of the stomach lining. This irritation can result from increased acid production (gastroesophageal reflux disease), infection, food poisoning, taking certain medicines (such as nonsteroidal anti-inflammatory drugs), alcohol use, or tobacco use. °· Signals from the brain. These signals could be caused by a headache, heat exposure, an inner ear disturbance, increased pressure in the brain from injury, infection, a tumor, or a concussion, pain, emotional stimulus, or metabolic problems. °· An obstruction in the gastrointestinal tract (bowel obstruction). °· Illnesses such as diabetes, hepatitis, gallbladder problems, appendicitis, kidney problems, cancer, sepsis, atypical symptoms of a heart attack, or eating disorders. °· Medical treatments such as chemotherapy and radiation. °· Receiving medicine that makes you sleep (general anesthetic) during surgery. °DIAGNOSIS °Your caregiver may ask for tests to be done if the problems do not improve after a few days. Tests may also be done if symptoms are severe or if the reason for the   nausea and vomiting is not clear. Tests may include: °· Urine tests. °· Blood tests. °· Stool tests. °· Cultures (to look for evidence of infection). °· X-rays or other imaging studies. °Test results can help your caregiver make  decisions about treatment or the need for additional tests. °TREATMENT °You need to stay well hydrated. Drink frequently but in small amounts. You may wish to drink water, sports drinks, clear broth, or eat frozen ice pops or gelatin dessert to help stay hydrated. When you eat, eating slowly may help prevent nausea. There are also some antinausea medicines that may help prevent nausea. °HOME CARE INSTRUCTIONS  °· Take all medicine as directed by your caregiver. °· If you do not have an appetite, do not force yourself to eat. However, you must continue to drink fluids. °· If you have an appetite, eat a normal diet unless your caregiver tells you differently. °¨ Eat a variety of complex carbohydrates (rice, wheat, potatoes, bread), lean meats, yogurt, fruits, and vegetables. °¨ Avoid high-fat foods because they are more difficult to digest. °· Drink enough water and fluids to keep your urine clear or pale yellow. °· If you are dehydrated, ask your caregiver for specific rehydration instructions. Signs of dehydration may include: °¨ Severe thirst. °¨ Dry lips and mouth. °¨ Dizziness. °¨ Dark urine. °¨ Decreasing urine frequency and amount. °¨ Confusion. °¨ Rapid breathing or pulse. °SEEK IMMEDIATE MEDICAL CARE IF:  °· You have blood or brown flecks (like coffee grounds) in your vomit. °· You have black or bloody stools. °· You have a severe headache or stiff neck. °· You are confused. °· You have severe abdominal pain. °· You have chest pain or trouble breathing. °· You do not urinate at least once every 8 hours. °· You develop cold or clammy skin. °· You continue to vomit for longer than 24 to 48 hours. °· You have a fever. °MAKE SURE YOU:  °· Understand these instructions. °· Will watch your condition. °· Will get help right away if you are not doing well or get worse. °  °This information is not intended to replace advice given to you by your health care provider. Make sure you discuss any questions you have with  your health care provider. °  °Document Released: 07/03/2005 Document Revised: 09/25/2011 Document Reviewed: 11/30/2010 °Elsevier Interactive Patient Education ©2016 [employer] ° °

## 2015-12-17 ENCOUNTER — Emergency Department
Admission: EM | Admit: 2015-12-17 | Discharge: 2015-12-17 | Disposition: A | Discharge status: Home / Self Care | Payer: Medicaid Other | Attending: Emergency Medicine | Admitting: Emergency Medicine

## 2015-12-17 ENCOUNTER — Emergency Department: Payer: Medicaid Other

## 2015-12-17 ENCOUNTER — Encounter: Payer: Self-pay | Admitting: Emergency Medicine

## 2015-12-17 DIAGNOSIS — Z3A08 8 weeks gestation of pregnancy: Secondary | ICD-10-CM | POA: Insufficient documentation

## 2015-12-17 DIAGNOSIS — O9A211 Injury, poisoning and certain other consequences of external causes complicating pregnancy, first trimester: Secondary | ICD-10-CM | POA: Insufficient documentation

## 2015-12-17 DIAGNOSIS — Y999 Unspecified external cause status: Secondary | ICD-10-CM | POA: Insufficient documentation

## 2015-12-17 DIAGNOSIS — F17210 Nicotine dependence, cigarettes, uncomplicated: Secondary | ICD-10-CM | POA: Insufficient documentation

## 2015-12-17 DIAGNOSIS — Y939 Activity, unspecified: Secondary | ICD-10-CM | POA: Diagnosis not present

## 2015-12-17 DIAGNOSIS — Y929 Unspecified place or not applicable: Secondary | ICD-10-CM | POA: Insufficient documentation

## 2015-12-17 DIAGNOSIS — S93401A Sprain of unspecified ligament of right ankle, initial encounter: Secondary | ICD-10-CM | POA: Diagnosis not present

## 2015-12-17 DIAGNOSIS — W109XXA Fall (on) (from) unspecified stairs and steps, initial encounter: Secondary | ICD-10-CM | POA: Insufficient documentation

## 2015-12-17 MED ORDER — ACETAMINOPHEN 500 MG PO TABS
1000.0000 mg | ORAL_TABLET | Freq: Once | ORAL | Status: AC
  Administered 2015-12-17: 1000 mg via ORAL

## 2015-12-17 MED ORDER — ACETAMINOPHEN 500 MG PO TABS
ORAL_TABLET | ORAL | Status: AC
  Filled 2015-12-17: qty 2

## 2015-12-17 NOTE — Discharge Instructions (Signed)
Ankle Sprain °An ankle sprain is an injury to the strong, fibrous tissues (ligaments) that hold the bones of your ankle joint together.  °CAUSES °An ankle sprain is usually caused by a fall or by twisting your ankle. Ankle sprains most commonly occur when you step on the outer edge of your foot, and your ankle turns inward. People who participate in sports are more prone to these types of injuries.  °SYMPTOMS  °· Pain in your ankle. The pain may be present at rest or only when you are trying to stand or walk. °· Swelling. °· Bruising. Bruising may develop immediately or within 1 to 2 days after your injury. °· Difficulty standing or walking, particularly when turning corners or changing directions. °DIAGNOSIS  °Your caregiver will ask you details about your injury and perform a physical exam of your ankle to determine if you have an ankle sprain. During the physical exam, your caregiver will press on and apply pressure to specific areas of your foot and ankle. Your caregiver will try to move your ankle in certain ways. An X-ray exam may be done to be sure a bone was not broken or a ligament did not separate from one of the bones in your ankle (avulsion fracture).  °TREATMENT  °Certain types of braces can help stabilize your ankle. Your caregiver can make a recommendation for this. Your caregiver may recommend the use of medicine for pain. If your sprain is severe, your caregiver may refer you to a surgeon who helps to restore function to parts of your skeletal system (orthopedist) or a physical therapist. °HOME CARE INSTRUCTIONS  °· Apply ice to your injury for 1-2 days or as directed by your caregiver. Applying ice helps to reduce inflammation and pain. °· Put ice in a plastic bag. °· Place a towel between your skin and the bag. °· Leave the ice on for 15-20 minutes at a time, every 2 hours while you are awake. °· Only take over-the-counter or prescription medicines for pain, discomfort, or fever as directed by  your caregiver. °· Elevate your injured ankle above the level of your heart as much as possible for 2-3 days. °· If your caregiver recommends crutches, use them as instructed. Gradually put weight on the affected ankle. Continue to use crutches or a cane until you can walk without feeling pain in your ankle. °· If you have a plaster splint, wear the splint as directed by your caregiver. Do not rest it on anything harder than a pillow for the first 24 hours. Do not put weight on it. Do not get it wet. You may take it off to take a shower or bath. °· You may have been given an elastic bandage to wear around your ankle to provide support. If the elastic bandage is too tight (you have numbness or tingling in your foot or your foot becomes cold and blue), adjust the bandage to make it comfortable. °· If you have an air splint, you may blow more air into it or let air out to make it more comfortable. You may take your splint off at night and before taking a shower or bath. Wiggle your toes in the splint several times per day to decrease swelling. °SEEK MEDICAL CARE IF:  °· You have rapidly increasing bruising or swelling. °· Your toes feel extremely cold or you lose feeling in your foot. °· Your pain is not relieved with medicine. °SEEK IMMEDIATE MEDICAL CARE IF: °· Your toes are numb or blue. °·   You have severe pain that is increasing. °MAKE SURE YOU:  °· Understand these instructions. °· Will watch your condition. °· Will get help right away if you are not doing well or get worse. °  °This information is not intended to replace advice given to you by your health care provider. Make sure you discuss any questions you have with your health care provider. °  °Document Released: 07/03/2005 Document Revised: 07/24/2014 Document Reviewed: 07/15/2011 °Elsevier Interactive Patient Education ©2016 [employer] ° °Elastic Bandage and RICE °WHAT DOES AN ELASTIC BANDAGE DO? °Elastic bandages come in different shapes and sizes.  They generally provide support to your injury and reduce swelling while you are healing, but they can perform different functions. Your health care provider will help you to decide what is best for your protection, recovery, or rehabilitation following an injury. °WHAT ARE SOME GENERAL TIPS FOR USING AN ELASTIC BANDAGE? °· Use the bandage as directed by the maker of the bandage that you are using. °· Do not wrap the bandage too tightly. This may cut off the circulation in the arm or leg in the area below the bandage. °· If part of your body beyond the bandage becomes blue, numb, cold, swollen, or is more painful, your bandage is most likely too tight. If this occurs, remove your bandage and reapply it more loosely. °· See your health care provider if the bandage seems to be making your problems worse rather than better. °· An elastic bandage should be removed and reapplied every 3-4 hours or as directed by your health care provider. °WHAT IS RICE? °The routine care of many injuries includes rest, ice, compression, and elevation (RICE therapy).  °Rest °Rest is required to allow your body to heal. Generally, you can resume your routine activities when you are comfortable and have been given permission by your health care provider. °Ice °Icing your injury helps to keep the swelling down and it reduces pain. Do not apply ice directly to your skin. °· Put ice in a plastic bag. °· Place a towel between your skin and the bag. °· Leave the ice on for 20 minutes, 2-3 times per day. °Do this for as long as you are directed by your health care provider. °Compression °Compression helps to keep swelling down, gives support, and helps with discomfort. Compression may be done with an elastic bandage. °Elevation °Elevation helps to reduce swelling and it decreases pain. If possible, your injured area should be placed at or above the level of your heart or the center of your chest. °WHEN SHOULD I SEEK MEDICAL CARE? °You should seek  medical care if: °· You have persistent pain and swelling. °· Your symptoms are getting worse rather than improving. °These symptoms may indicate that further evaluation or further X-rays are needed. Sometimes, X-rays may not show a small broken bone (fracture) until a number of days later. Make a follow-up appointment with your health care provider. Ask when your X-ray results will be ready. Make sure that you get your X-ray results. °WHEN SHOULD I SEEK IMMEDIATE MEDICAL CARE? °You should seek immediate medical care if: °· You have a sudden onset of severe pain at or below the area of your injury. °· You develop redness or increased swelling around your injury. °· You have tingling or numbness at or below the area of your injury that does not improve after you remove the elastic bandage. °  °This information is not intended to replace advice given to you by your   health care provider. Make sure you discuss any questions you have with your health care provider. °  °Document Released: 12/23/2001 Document Revised: 03/24/2015 Document Reviewed: 02/16/2014 °Elsevier Interactive Patient Education ©2016 [employer] ° °Stirrup Ankle Brace °Stirrup ankle braces give support and help stabilize the ankle joint. They are rigid pieces of plastic or fiberglass that go up both sides of the lower leg with the bottom of the stirrup fitting comfortably under the bottom of the instep of the foot. It can be held on with Velcro straps or an elastic wrap. Stirrup ankle braces are used to support the ankle following mild or moderate sprains or strains, or fractures after cast removal.  °They can be easily removed or adjusted if there is swelling. The rigid brace shells are designed to fit the ankle comfortably and provide the needed medial/lateral stabilization. This brace can be easily worn with most athletic shoes. The brace liner is usually made of a soft, comfortable gel-like material. This gel fits the ankle well without causing  uncomfortable pressure points.  °IMPORTANCE OF ANKLE BRACES: °· The use of ankle bracing is effective in the prevention of ankle sprains. °· In athletes, the use of ankle bracing will offer protection and prevent further sprains. °· Research shows that a complete rehabilitation program needs to be included with external bracing. This includes range of motion and ankle strengthening exercises. Your caregivers will instruct you in this. °If you were given the brace today for a new injury, use the following home care instructions as a guide. °HOME CARE INSTRUCTIONS  °· Apply ice to the sore area for 15-20 minutes, 03-04 times per day while awake for the first 2 days. Put the ice in a plastic bag and place a towel between the bag of ice and your skin. Never place the ice pack directly on your skin. Be especially careful using ice on an elbow or knee or other bony area, such as your ankle, because icing for too long may damage the nerves which are close to the surface. °· Keep your leg elevated when possible to lessen swelling. °· Wear your splint until you are seen for a follow-up examination. Do not put weight on it. Do not get it wet. You may take it off to take a shower or bath. °· For Activity: Use crutches with non-weight bearing for 1 week. Then, you may walk on your ankle as instructed. Start gradually with weight bearing on the affected ankle. °· Continue to use crutches or a cane until you can stand on your ankle without causing pain. °· Wiggle your toes in the splint several times per day if you are able. °· The splint is too tight if you have numbness, tingling, or if your foot becomes cold and blue. Adjust the straps or elastic bandage to make it comfortable. °· Only take over-the-counter or prescription medicines for pain, discomfort, or fever as directed by your caregiver. °SEEK IMMEDIATE MEDICAL CARE IF:  °· You have increased bruising, swelling or pain. °· Your toes are blue or cold and loosening the  brace or wrap does not help. °· Your pain is not relieved with medicine. °MAKE SURE YOU:  °· Understand these instructions. °· Will watch your condition. °· Will get help right away if you are not doing well or get worse. °  °This information is not intended to replace advice given to you by your health care provider. Make sure you discuss any questions you have with your health care provider. °  °  Document Released: 05/03/2004 Document Revised: 09/25/2011 Document Reviewed: 02/02/2015 °Elsevier Interactive Patient Education ©2016 [employer] Inc. ° °

## 2015-12-17 NOTE — ED Provider Notes (Signed)
[facility] Emergency Department Provider Note  ____________________________________________  Time seen: Approximately 4:42 PM  I have reviewed the triage vital signs and the nursing notes.   HISTORY  Chief Complaint Foot Pain    HPI Tammy Francis is a 30 y.o. female who presents emergency department complaining of right ankle pain. Patient states that she is [redacted] weeks pregnant and slipped on some stairs. In an effort not to land on her stomach she tried to hold onto the railing and ended up twisting her ankle. Patient reports swelling, pain, numbness and tingling in her toes. Patient did not hit her head or lose consciousness. She did not hit her abdomen. No abdominal pain, vaginal bleeding or discharge.   Past Medical History  Diagnosis Date  . Recurrent boils     There are no active problems to display for this patient.   Past Surgical History  Procedure Laterality Date  . Bartholins cyst      had gland removed.  . Colposcopy    . Tonsillectomy    . Cervical cone biopsy    . Cesarean section  2010  . Cholecystectomy    . Adenoidectomy      Current Outpatient Rx  Name  Route  Sig  Dispense  Refill  . metoCLOPramide (REGLAN) 10 MG tablet   Oral   Take 1 tablet (10 mg total) by mouth every 6 (six) hours as needed for nausea.   20 tablet   0     Allergies Penicillins; Vicodin; and Penicillins cross reactors  No family history on file.  Social History Social History  Substance Use Topics  . Smoking status: Current Every Day Smoker -- 1.00 packs/day for 7 years    Types: Cigarettes  . Smokeless tobacco: Not on file  . Alcohol Use: 0.0 oz/week    0 Standard drinks or equivalent per week     Review of Systems  Constitutional: No fever/chills Cardiovascular: no chest pain. Respiratory: no cough. No SOB. Gastrointestinal: No abdominal pain.  No nausea, no vomiting.   Genitourinary: No vaginal discharge or  bleeding Musculoskeletal: Positive for right ankle pain. Skin: Negative for rash, abrasions, lacerations, ecchymosis. Neurological: Negative for headaches, focal weakness or numbness. 10-point ROS otherwise negative.  ____________________________________________   PHYSICAL EXAM:  VITAL SIGNS: ED Triage Vitals  Enc Vitals Group     BP 12/17/15 1548 130/75 mmHg     Pulse Rate 12/17/15 1548 77     Resp 12/17/15 1548 18     Temp 12/17/15 1548 98.4 F (36.9 C)     Temp Source 12/17/15 1548 Oral     SpO2 12/17/15 1548 100 %     Weight 12/17/15 1548 208 lb (94.348 kg)     Height 12/17/15 1548  (1.626 m)     Head Cir --      Peak Flow --      Pain Score 12/17/15 1549 10     Pain Loc --      Pain Edu? --      Excl. in GC? --      Constitutional: Alert and oriented. Well appearing and in no acute distress. Eyes: Conjunctivae are normal. PERRL. EOMI. Head: Atraumatic. Cardiovascular: Normal rate, regular rhythm. Normal S1 and S2.  Good peripheral circulation. Respiratory: Normal respiratory effort without tachypnea or retractions. Lungs CTAB. Good air entry to the bases with no decreased or absent breath sounds. Gastrointestinal: Bowel sounds 4 quadrants. Soft and nontender to palpation. No guarding  or rigidity. No palpable masses. No distention. No CVA tenderness. Musculoskeletal: Full range of motion to all extremities. No gross deformities appreciated. Neurologic:  Normal speech and language. No gross focal neurologic deficits are appreciated.  Skin:  Skin is warm, dry and intact. No rash noted. Psychiatric: Mood and affect are normal. Speech and behavior are normal. Patient exhibits appropriate insight and judgement.   ____________________________________________   LABS (all labs ordered are listed, but only abnormal results are displayed)  Labs Reviewed - No data to  display ____________________________________________  EKG   ____________________________________________  RADIOLOGY Festus BarrenI, Jonathan D Cuthriell, personally viewed and evaluated these images (plain radiographs) as part of my medical decision making, as well as reviewing the written report by the radiologist.  Dg Foot Complete Right  12/17/2015  CLINICAL DATA:  Pain after fall EXAM: RIGHT FOOT COMPLETE - 3+ VIEW COMPARISON:  June 20, 2013 FINDINGS: There is no evidence of fracture or dislocation. There is no evidence of arthropathy or other focal bone abnormality. Soft tissues are unremarkable. IMPRESSION: Negative. Electronically Signed   By: Gerome Samavid  Williams III M.D   On: 12/17/2015 16:26    ____________________________________________    PROCEDURES  Procedure(s) performed:       Medications  acetaminophen (TYLENOL) tablet 1,000 mg (1,000 mg Oral Given 12/17/15 1551)     ____________________________________________   INITIAL IMPRESSION / ASSESSMENT AND PLAN / ED COURSE  Pertinent labs & imaging results that were available during my care of the patient were reviewed by me and considered in my medical decision making (see chart for details).  Patient's diagnosis is consistent with Right ankle sprain. X-ray reveals no acute osseous abnormality. Patient is a weeks pregnant and is unable to take NSAIDs. She is encouraged to use Tylenol at home. She is given [facility] emergency department. Instructed to use ASO lace-up stirrup ankle brace at home. Patient is a week pregnant but did not hit her abdomen, is not complaining of abdominal pain, no bleeding..  Patient is given ED precautions to return to the ED for any worsening or new symptoms.     ____________________________________________  FINAL CLINICAL IMPRESSION(S) / ED DIAGNOSES  Final diagnoses:  Ankle sprain, right, initial encounter      NEW MEDICATIONS STARTED DURING THIS VISIT:  New Prescriptions   No medications  on file        This chart was dictated using voice recognition software/Dragon. Despite best efforts to proofread, errors can occur which can change the meaning. Any change was purely unintentional.    Racheal PatchesJonathan D Cuthriell, PA-C 12/17/15 1709  Sharman CheekPhillip Stafford, MD 12/17/15 [phone]

## 2015-12-17 NOTE — ED Notes (Signed)
States she started to fall   Twisted right foot  States she is not able to bear full wt d/t pain

## 2015-12-17 NOTE — ED Notes (Signed)
Pt states that she started to fall and in order to not land on her abd she twisted her rt foot under, pt is having rt foot pain, pt states that she is 8 weeks preg, no injury to her abd

## 2016-07-27 ENCOUNTER — Encounter: Payer: Self-pay | Admitting: Emergency Medicine

## 2016-07-27 ENCOUNTER — Emergency Department
Admission: EM | Admit: 2016-07-27 | Discharge: 2016-07-27 | Disposition: A | Discharge status: Home / Self Care | Payer: Medicaid Other | Attending: Emergency Medicine | Admitting: Emergency Medicine

## 2016-07-27 ENCOUNTER — Emergency Department: Payer: Medicaid Other

## 2016-07-27 DIAGNOSIS — Z87891 Personal history of nicotine dependence: Secondary | ICD-10-CM | POA: Insufficient documentation

## 2016-07-27 DIAGNOSIS — Z5321 Procedure and treatment not carried out due to patient leaving prior to being seen by health care provider: Secondary | ICD-10-CM | POA: Diagnosis not present

## 2016-07-27 DIAGNOSIS — H538 Other visual disturbances: Secondary | ICD-10-CM | POA: Insufficient documentation

## 2016-07-27 DIAGNOSIS — O9989 Other specified diseases and conditions complicating pregnancy, childbirth and the puerperium: Secondary | ICD-10-CM | POA: Insufficient documentation

## 2016-07-27 DIAGNOSIS — M7989 Other specified soft tissue disorders: Secondary | ICD-10-CM | POA: Insufficient documentation

## 2016-07-27 DIAGNOSIS — R200 Anesthesia of skin: Secondary | ICD-10-CM | POA: Insufficient documentation

## 2016-07-27 NOTE — ED Triage Notes (Signed)
Pt comes into the ED via POV c/o blurred vision, numbness in the left arm, and bilateral leg swelling.  Patient underwent cesarean last Friday and she called her OB who instructed to come here.  Patient have even and unlabored respirations, neurologically intact, and in NAD at this time.

## 2016-12-04 IMAGING — US US ABDOMEN LIMITED
1 series · 14 of 25 positions shown · non-contrast
Comparison: Prior studies from 09/13/2014 as well as 06/15/2014.

CLINICAL DATA: Initial valuation for acute abdominal pain for 3
weeks. History of prior cholecystectomy.

EXAM:
US ABDOMEN LIMITED - RIGHT UPPER QUADRANT

[Series 1: us abdomen limited · 0.20mm/px · 14 of 66 slices shown]
[im 1/66]
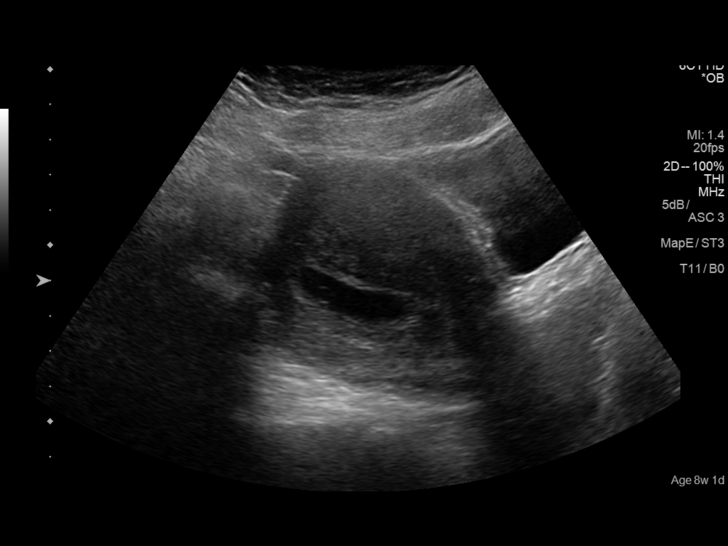
[im 6/66]
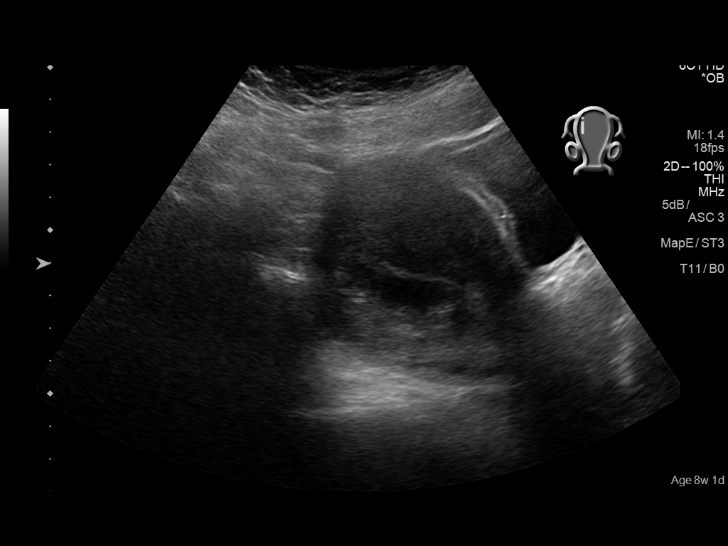
[im 11/66]
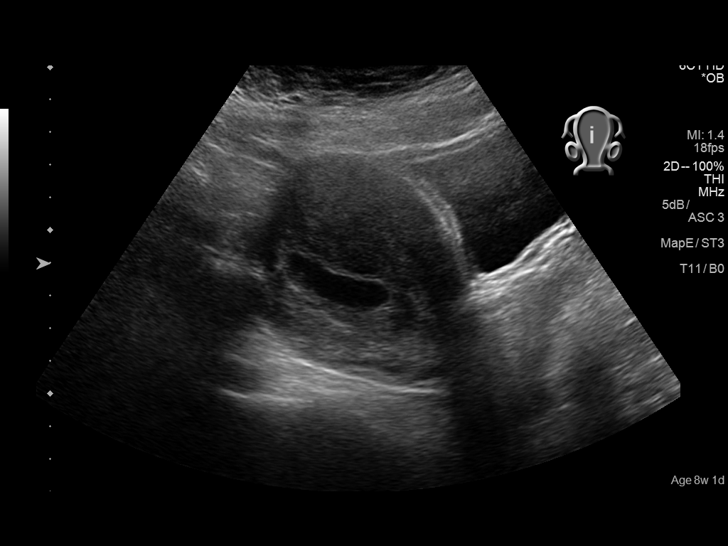
[im 17/66]
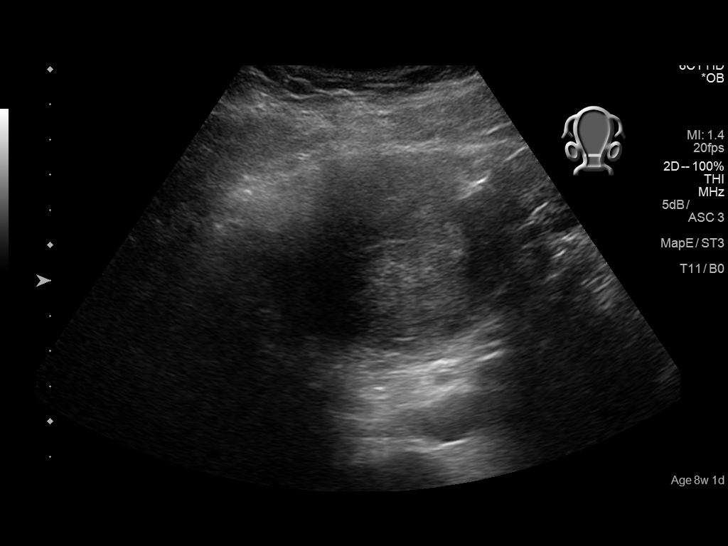
[im 22/66]
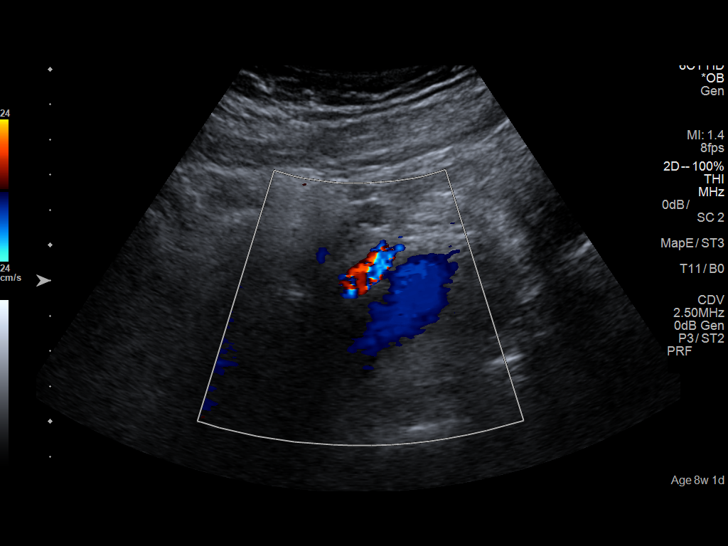
[im 25/66]
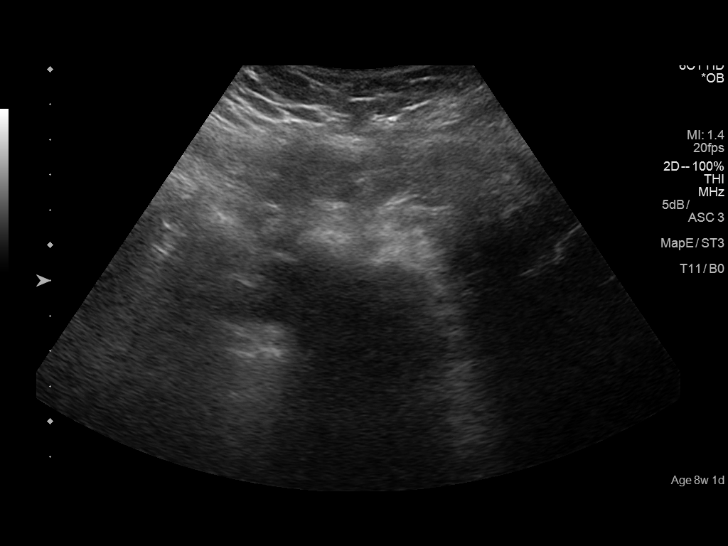
[im 30/66]
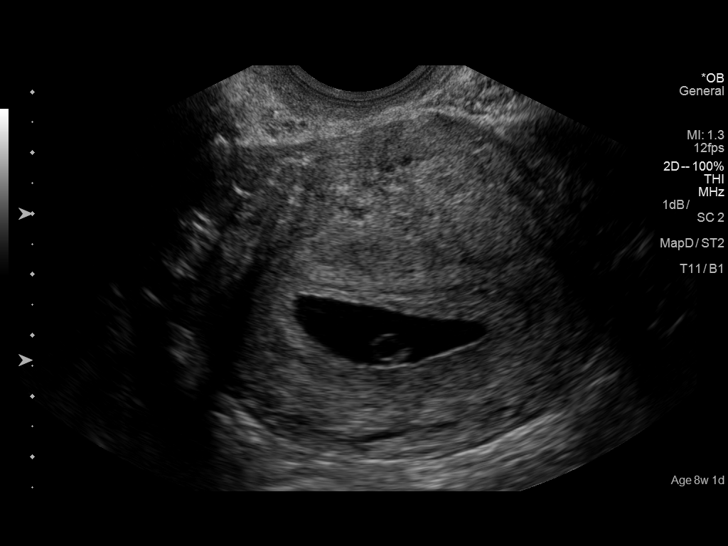
[im 36/66]
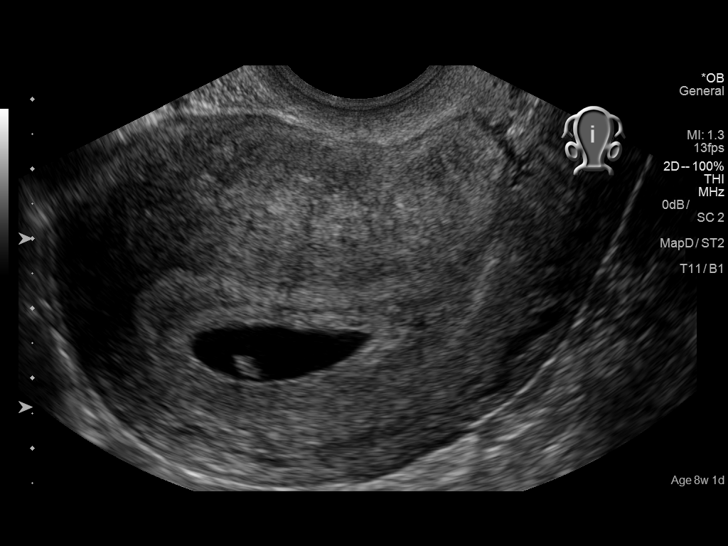
[im 41/66]
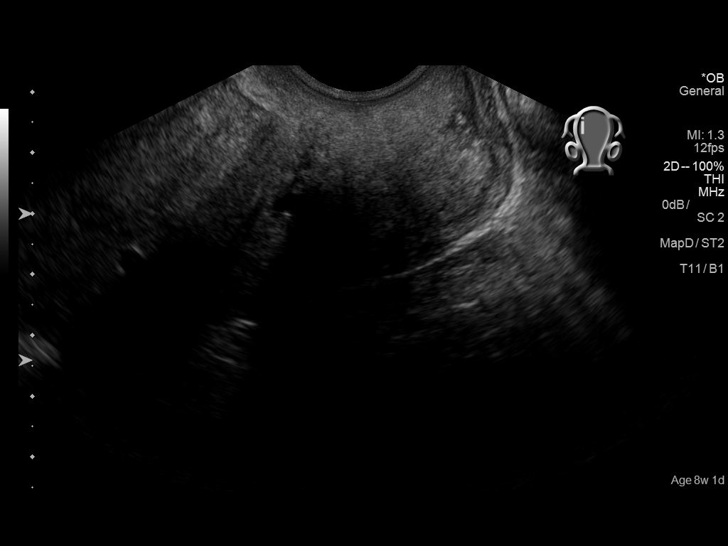
[im 44/66]
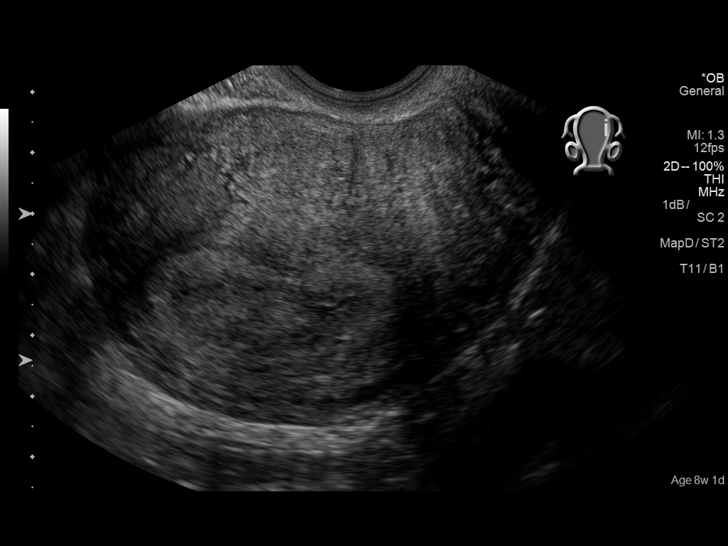
[im 49/66]
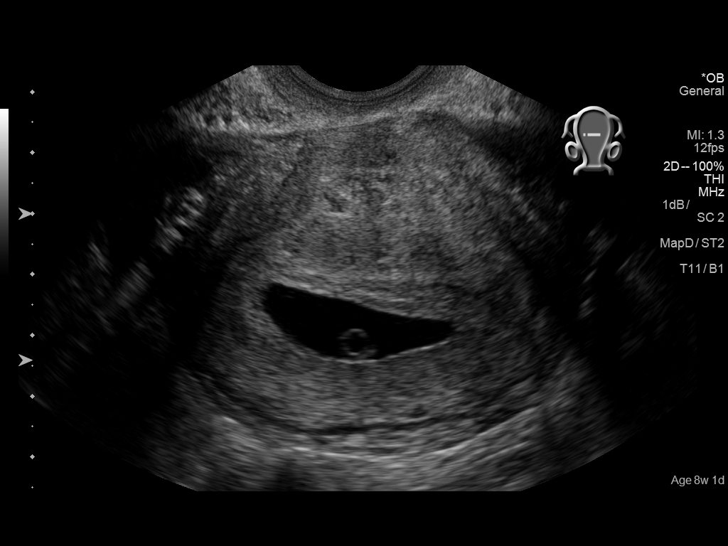
[im 55/66]
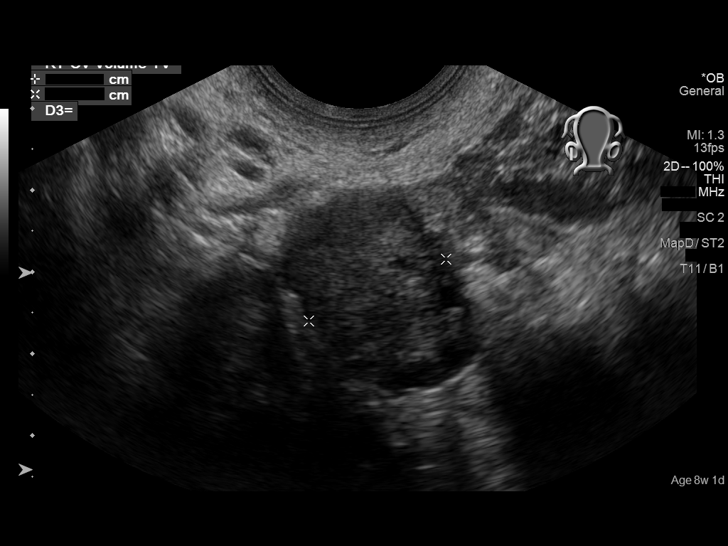
[im 60/66]
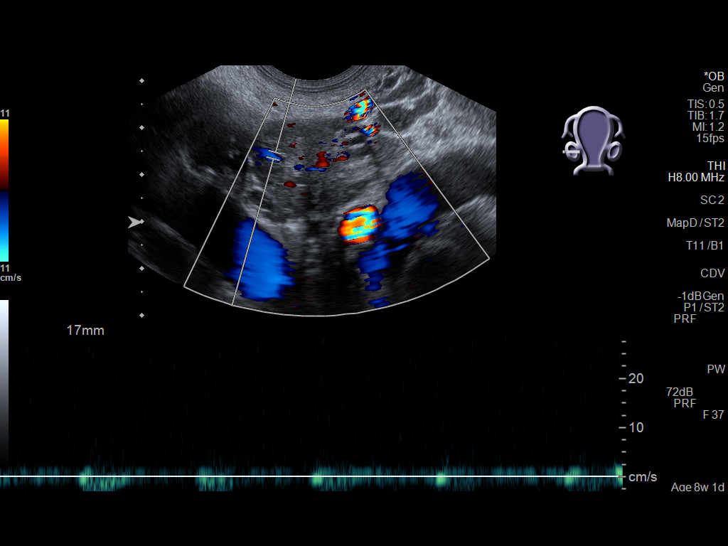
[im 66/66]
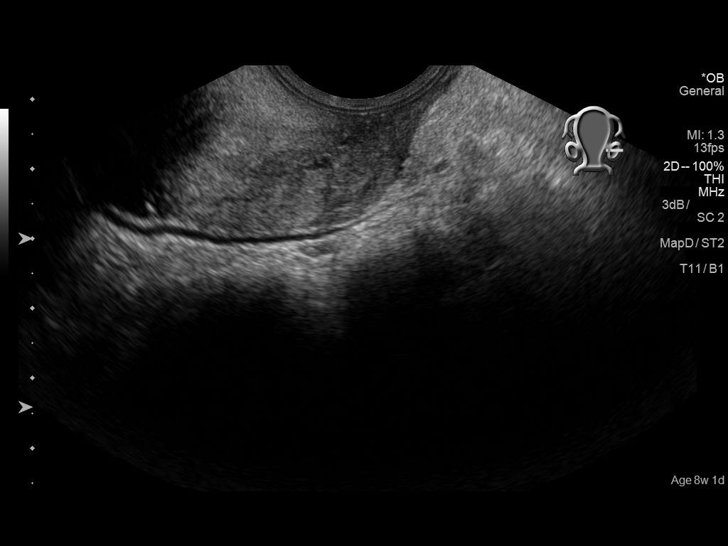

[14 of 25 positions shown; findings below may reference images not displayed]

FINDINGS: Gallbladder:

Status post cholecystectomy.

Common bile duct:

Diameter: 3.2 mm

Liver:

No focal lesion identified. Within normal limits in parenchymal
echogenicity.
IMPRESSION: Status post cholecystectomy. No biliary dilatation or other acute
abnormality identified.

## 2016-12-27 ENCOUNTER — Encounter: Payer: Self-pay | Admitting: *Deleted

## 2016-12-27 ENCOUNTER — Emergency Department
Admission: EM | Admit: 2016-12-27 | Discharge: 2016-12-27 | Disposition: A | Discharge status: Home / Self Care | Payer: Medicaid Other | Attending: Emergency Medicine | Admitting: Emergency Medicine

## 2016-12-27 DIAGNOSIS — Y999 Unspecified external cause status: Secondary | ICD-10-CM | POA: Diagnosis not present

## 2016-12-27 DIAGNOSIS — Z87891 Personal history of nicotine dependence: Secondary | ICD-10-CM | POA: Insufficient documentation

## 2016-12-27 DIAGNOSIS — Y9389 Activity, other specified: Secondary | ICD-10-CM | POA: Diagnosis not present

## 2016-12-27 DIAGNOSIS — Y92410 Unspecified street and highway as the place of occurrence of the external cause: Secondary | ICD-10-CM | POA: Diagnosis not present

## 2016-12-27 DIAGNOSIS — M791 Myalgia: Secondary | ICD-10-CM | POA: Insufficient documentation

## 2016-12-27 DIAGNOSIS — M7918 Myalgia, other site: Secondary | ICD-10-CM

## 2016-12-27 MED ORDER — NAPROXEN 500 MG PO TABS
500.0000 mg | ORAL_TABLET | Freq: Once | ORAL | Status: AC
  Administered 2016-12-27: 500 mg via ORAL
  Filled 2016-12-27: qty 1

## 2016-12-27 MED ORDER — CYCLOBENZAPRINE HCL 10 MG PO TABS
10.0000 mg | ORAL_TABLET | Freq: Once | ORAL | Status: AC
  Administered 2016-12-27: 10 mg via ORAL
  Filled 2016-12-27: qty 1

## 2016-12-27 MED ORDER — METHOCARBAMOL 750 MG PO TABS: 750.0000 mg | ORAL_TABLET | Freq: Four times a day (QID) | ORAL | 0 refills | Status: DC

## 2016-12-27 MED ORDER — TRAMADOL HCL 50 MG PO TABS
50.0000 mg | ORAL_TABLET | Freq: Once | ORAL | Status: AC
  Administered 2016-12-27: 50 mg via ORAL
  Filled 2016-12-27: qty 1

## 2016-12-27 MED ORDER — NAPROXEN 500 MG PO TABS: 500.0000 mg | ORAL_TABLET | Freq: Two times a day (BID) | ORAL | Status: DC

## 2016-12-27 MED ORDER — TRAMADOL HCL 50 MG PO TABS: 50.0000 mg | ORAL_TABLET | Freq: Four times a day (QID) | ORAL | 0 refills | Status: AC | PRN
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
## 2018-04-03 ENCOUNTER — Emergency Department: Admission: EM | Admit: 2018-04-03 | Discharge: 2018-04-03 | Discharge status: Against Medical Advice | Payer: Self-pay

## 2018-04-03 NOTE — ED Notes (Signed)
Patient called to be triaged x1, no response noted at this time from patient.  Staff checked restrooms, lobby, and outdoor facility.   

## 2018-04-03 NOTE — ED Notes (Signed)
Patient called to be triaged x3, no response noted at this time from patient.  Staff checked restrooms, lobby, and outdoor facility.

## 2018-04-03 NOTE — ED Notes (Signed)
Patient called to be triaged x2, no response noted at this time from patient.  Staff checked restrooms, lobby, and outdoor facility.   

## 2018-05-02 ENCOUNTER — Emergency Department
Admission: EM | Admit: 2018-05-02 | Discharge: 2018-05-02 | Disposition: A | Discharge status: Home / Self Care | Payer: Medicaid Other | Attending: Emergency Medicine | Admitting: Emergency Medicine

## 2018-05-02 ENCOUNTER — Emergency Department: Payer: Medicaid Other

## 2018-05-02 ENCOUNTER — Encounter: Payer: Self-pay | Admitting: Emergency Medicine

## 2018-05-02 ENCOUNTER — Other Ambulatory Visit: Payer: Self-pay

## 2018-05-02 DIAGNOSIS — T5991XA Toxic effect of unspecified gases, fumes and vapors, accidental (unintentional), initial encounter: Secondary | ICD-10-CM | POA: Diagnosis not present

## 2018-05-02 DIAGNOSIS — F17210 Nicotine dependence, cigarettes, uncomplicated: Secondary | ICD-10-CM | POA: Insufficient documentation

## 2018-05-02 DIAGNOSIS — Z79899 Other long term (current) drug therapy: Secondary | ICD-10-CM | POA: Insufficient documentation

## 2018-05-02 DIAGNOSIS — R51 Headache: Secondary | ICD-10-CM | POA: Diagnosis present

## 2018-05-02 LAB — BASIC METABOLIC PANEL
Anion gap: 7 (ref 5–15)
BUN: 6 mg/dL (ref 6–20)
CALCIUM: 8.9 mg/dL (ref 8.9–10.3)
CO2: 26 mmol/L (ref 22–32)
CREATININE: 0.85 mg/dL (ref 0.44–1.00)
Chloride: 107 mmol/L (ref 98–111)
GFR calc Af Amer: >60 mL/min (ref >60)
GLUCOSE: 96 mg/dL (ref 70–99)
POTASSIUM: 3.4 mmol/L — AB (ref 3.5–5.1)
SODIUM: 140 mmol/L (ref 135–145)

## 2018-05-02 LAB — CBC
HCT: 40.7 % (ref 36.0–46.0)
HEMOGLOBIN: 12.7 g/dL (ref 12.0–15.0)
MCH: 21.3 pg — AB (ref 26.0–34.0)
MCHC: 31.2 g/dL (ref 30.0–36.0)
MCV: 68.4 fL — ABNORMAL LOW (ref 80.0–100.0)
PLATELETS: 322 10*3/uL (ref 150–400)
RBC: 5.95 MIL/uL — ABNORMAL HIGH (ref 3.87–5.11)
RDW: 17.4 % — AB (ref 11.5–15.5)
WBC: 7.9 10*3/uL (ref 4.0–10.5)
nRBC: 0 % (ref 0.0–0.2)

## 2018-05-02 LAB — CARBOXYHEMOGLOBIN - COOX: CARBOXYHEMOGLOBIN: 4.4 % — AB (ref 0.5–1.5)

## 2018-05-02 LAB — TROPONIN I

## 2018-05-02 MED ORDER — ONDANSETRON HCL 4 MG PO TABS
4.0000 mg | ORAL_TABLET | Freq: Once | ORAL | Status: AC
  Administered 2018-05-02: 4 mg via ORAL
  Filled 2018-05-02: qty 1

## 2018-05-02 NOTE — ED Notes (Signed)
Water given for PO challenge. Pt states she feels better.
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
## 2018-05-03 LAB — METHEMOGLOBIN, BLOOD: METHEMOGLOBIN, BLOOD: 0.3 % — AB (ref 0.4–1.5)

## 2019-11-30 ENCOUNTER — Emergency Department (HOSPITAL_COMMUNITY)
Admission: EM | Admit: 2019-11-30 | Discharge: 2019-11-30 | Disposition: A | Discharge status: Home / Self Care | Payer: Medicaid Other | Attending: Emergency Medicine | Admitting: Emergency Medicine

## 2019-11-30 ENCOUNTER — Other Ambulatory Visit: Payer: Self-pay

## 2019-11-30 ENCOUNTER — Encounter (HOSPITAL_COMMUNITY): Payer: Self-pay

## 2019-11-30 DIAGNOSIS — N898 Other specified noninflammatory disorders of vagina: Secondary | ICD-10-CM | POA: Diagnosis present

## 2019-11-30 DIAGNOSIS — B373 Candidiasis of vulva and vagina: Secondary | ICD-10-CM | POA: Insufficient documentation

## 2019-11-30 DIAGNOSIS — Z87891 Personal history of nicotine dependence: Secondary | ICD-10-CM | POA: Diagnosis not present

## 2019-11-30 DIAGNOSIS — B3731 Acute candidiasis of vulva and vagina: Secondary | ICD-10-CM

## 2019-11-30 LAB — WET PREP, GENITAL
Clue Cells Wet Prep HPF POC: NONE SEEN
Sperm: NONE SEEN
Trich, Wet Prep: NONE SEEN

## 2019-11-30 LAB — URINALYSIS, ROUTINE W REFLEX MICROSCOPIC
Bilirubin Urine: NEGATIVE
Glucose, UA: NEGATIVE mg/dL
Ketones, ur: NEGATIVE mg/dL
Nitrite: NEGATIVE
Protein, ur: NEGATIVE mg/dL
Specific Gravity, Urine: 1.023 (ref 1.005–1.030)
pH: 5 (ref 5.0–8.0)

## 2019-11-30 LAB — I-STAT BETA HCG BLOOD, ED (MC, WL, AP ONLY): I-stat hCG, quantitative: <5 m[IU]/mL (ref <5)

## 2019-11-30 LAB — HIV ANTIBODY (ROUTINE TESTING W REFLEX): HIV Screen 4th Generation wRfx: NONREACTIVE

## 2019-11-30 MED ORDER — FLUCONAZOLE 150 MG PO TABS: 150.0000 mg | ORAL_TABLET | Freq: Once | ORAL | 0 refills | Status: AC

## 2019-11-30 MED ORDER — KETOCONAZOLE 2 % EX CREA: 1.0000 "application " | TOPICAL_CREAM | Freq: Every day | CUTANEOUS | 0 refills | Status: AC

## 2019-11-30 MED ORDER — FLUCONAZOLE 100 MG PO TABS
100.0000 mg | ORAL_TABLET | Freq: Once | ORAL | Status: AC
  Administered 2019-11-30: 100 mg via ORAL
  Filled 2019-11-30: qty 1

## 2019-11-30 NOTE — ED Provider Notes (Signed)
[facility] EMERGENCY DEPARTMENT Provider Note   CSN: [id number] Arrival date & time: 11/30/19  0813     History Chief Complaint  Patient presents with  . Vaginal Discharge    Tammy Francis is a 34 y.o. female who presents to the emergency department chief complaint of vaginal discharge.  Patient states that she has not been sexually active for 18 months and had sex 4 days ago.  3 days ago she began having burning, itching and thick vaginal discharge consistent with previous vaginal yeast infections.  She complains of significant discomfort at the vaginal introitus.  She has been using Vagisil antiitch lotion and put a Monistat and last night but states that her symptoms have not improved and she is extremely uncomfortable.  She denies any pelvic pain, urinary symptoms, fevers.  HPI     Past Medical History:  Diagnosis Date  . Recurrent boils     There are no problems to display for this patient.   Past Surgical History:  Procedure Laterality Date  . ADENOIDECTOMY    . bartholins cyst     had gland removed.  . CERVICAL CONE BIOPSY    . CESAREAN SECTION  2010  . CHOLECYSTECTOMY    . COLPOSCOPY    . TONSILLECTOMY       OB History    Gravida  2   Para      Term      Preterm      AB      Living  1     SAB      TAB      Ectopic      Multiple      Live Births           Obstetric Comments  1st Menstrual Cycle:  11 1st Pregnancy:  22         No family history on file.  Social History   Tobacco Use  . Smoking status: Former Smoker    Packs/day: 1.00    Years: 7.00    Pack years: 7.00    Types: Cigarettes  . Smokeless tobacco: Never Used  Substance Use Topics  . Alcohol use: Yes    Alcohol/week: 0.0 standard drinks  . Drug use: No    Home Medications Prior to Admission medications   Medication Sig Start Date End Date Taking? Authorizing Provider  AZO-CRANBERRY PO Take 4 tablets by mouth daily as needed (pain).   Yes [provider]  miconazole (MONISTAT 1 COMBINATION PACK) kit Place 1 each vaginally once.   Yes [provider]    Allergies    Penicillins, Vicodin [hydrocodone-acetaminophen], and Penicillins cross reactors  Review of Systems   Review of Systems Ten systems reviewed and are negative for acute change, except as noted in the HPI.   Physical Exam Updated Vital Signs BP 130/77 (BP Location: Left Arm)   Pulse 82   Temp 98.4 F (36.9 C) (Oral)   Resp 16   SpO2 100%   Physical Exam Physical Exam  Nursing note and vitals reviewed. Constitutional: She is oriented to person, place, and time. She appears well-developed and well-nourished. No distress.  HENT:  Head: Normocephalic and atraumatic.  Eyes: Conjunctivae normal and EOM are normal. Pupils are equal, round, and reactive to light. No scleral icterus.  Neck: Normal range of motion.  Cardiovascular: Normal rate, regular rhythm and normal heart sounds.  Exam reveals no gallop and no friction rub.   No murmur heard.  Pulmonary/Chest: Effort normal and breath sounds normal. No respiratory distress.  Abdominal: Soft. Bowel sounds are normal. She exhibits no distension and no mass. There is no tenderness. There is no guarding.  GU: Normal external female genitalia, thick, curd-like discharge in the vagina with erythematous, irritated appearing vaginal rugae at the introitus.  Cervix with minimal muco-sanguinous discharge, nulliparous os.  No cervical motion tenderness, no adnexal fullness or tenderness. Neurological: She is alert and oriented to person, place, and time.  Skin: Skin is warm and dry. She is not diaphoretic.    ED Results / Procedures / Treatments   Labs (all labs ordered are listed, but only abnormal results are displayed) Labs Reviewed  WET PREP, GENITAL - Abnormal; Notable for the following components:      Result Value   Yeast Wet Prep HPF POC FEW (*)    WBC, Wet Prep HPF POC FEW (*)    All other  components within normal limits  URINALYSIS, ROUTINE W REFLEX MICROSCOPIC - Abnormal; Notable for the following components:   APPearance HAZY (*)    Hgb urine dipstick SMALL (*)    Leukocytes,Ua SMALL (*)    Bacteria, UA MANY (*)    All other components within normal limits  RPR  HIV ANTIBODY (ROUTINE TESTING W REFLEX)  I-STAT BETA HCG BLOOD, ED ([facility], [facility], [facility] ONLY)  GC/CHLAMYDIA PROBE AMP ([place]) NOT AT [facility]    EKG None  Radiology No results found.  Procedures Procedures (including critical care time)  Medications Ordered in ED Medications - No data to display  ED Course  I have reviewed the triage vital signs and the nursing notes.  Pertinent labs & imaging results that were available during my care of the patient were reviewed by me and considered in my medical decision making (see chart for details).  Clinical Course as of Nov 29 937  Sun Nov 30, 2019  1314 Patient here with vaginal discharge and itching. DDX includes STI and non-STI vaginal infection. No pelvic pain or si/sx of PID or UTI. Labs including hcg (negative) chlamydia/gc, HIV, RPR, wet prep, and UA pending .   [AH]  3888 Patient's wet prep and urine have returned.  I suspect her urine is contaminated.  She had no suprapubic tenderness on bimanual examination.  Wet prep is positive for yeast.  We will treat with Diflucan here with a repeat dose in 1 week and topical ketoconazole for vulvovaginitis.  Patient appears otherwise appropriate for discharge.  She may follow-up with OB/GYN.   [AH]    Clinical Course User Index [AH] Margarita Mail, PA-C   MDM Rules/Calculators/A&P                      Patient with yeast vaginitis.  See ED course for MDM. Final Clinical Impression(s) / ED Diagnoses Final diagnoses:  None    Rx / DC Orders ED Discharge Orders    None       Margarita Mail, PA-C 11/30/19 0940    Fredia Sorrow, MD 12/04/19 [phone]

## 2019-11-30 NOTE — ED Triage Notes (Signed)
Pt reports she hadnt had sex in 18 months and then had sex, now for 3 days she has had burning, itching and has white discharge. Pt has tried [facility]

## 2019-11-30 NOTE — Discharge Instructions (Addendum)
Contact a health care provider if:  You have a fever.  Your symptoms go away and then return.  Your symptoms do not get better with treatment.  Your symptoms get worse.  You have new symptoms.  You develop blisters in or around your vagina.  You have blood coming from your vagina and it is not your menstrual period.  You develop pain in your abdomen.

## 2019-12-01 LAB — GC/CHLAMYDIA PROBE AMP (~~LOC~~) NOT AT ARMC
Chlamydia: NEGATIVE
Comment: NEGATIVE
Comment: NORMAL
Neisseria Gonorrhea: NEGATIVE

## 2019-12-01 LAB — RPR: RPR Ser Ql: NONREACTIVE

## 2022-05-30 ENCOUNTER — Encounter (HOSPITAL_BASED_OUTPATIENT_CLINIC_OR_DEPARTMENT_OTHER): Payer: Self-pay | Admitting: Emergency Medicine

## 2022-05-30 ENCOUNTER — Other Ambulatory Visit: Payer: Self-pay

## 2022-05-30 ENCOUNTER — Emergency Department (HOSPITAL_BASED_OUTPATIENT_CLINIC_OR_DEPARTMENT_OTHER): Payer: No Typology Code available for payment source

## 2022-05-30 ENCOUNTER — Emergency Department (HOSPITAL_BASED_OUTPATIENT_CLINIC_OR_DEPARTMENT_OTHER)
Admission: EM | Admit: 2022-05-30 | Discharge: 2022-05-30 | Disposition: A | Discharge status: Home / Self Care | Payer: No Typology Code available for payment source | Attending: Emergency Medicine | Admitting: Emergency Medicine

## 2022-05-30 DIAGNOSIS — Y92410 Unspecified street and highway as the place of occurrence of the external cause: Secondary | ICD-10-CM | POA: Diagnosis not present

## 2022-05-30 DIAGNOSIS — M542 Cervicalgia: Secondary | ICD-10-CM | POA: Diagnosis not present

## 2022-05-30 DIAGNOSIS — R519 Headache, unspecified: Secondary | ICD-10-CM | POA: Insufficient documentation

## 2022-05-30 MED ORDER — KETOROLAC TROMETHAMINE 15 MG/ML IJ SOLN
15.0000 mg | Freq: Once | INTRAMUSCULAR | Status: AC
  Administered 2022-05-30: 15 mg via INTRAMUSCULAR
  Filled 2022-05-30: qty 1

## 2022-05-30 MED ORDER — IBUPROFEN 800 MG PO TABS: 800.0000 mg | ORAL_TABLET | Freq: Three times a day (TID) | ORAL | 0 refills | Status: AC

## 2022-05-30 NOTE — ED Notes (Signed)
Provider at the Bedside. 

## 2022-05-30 NOTE — ED Notes (Signed)
Patient transported to Radiology 

## 2022-05-30 NOTE — ED Notes (Signed)
Dc instructions reviewed with patient. Patient voiced understanding. Dc with belongings.  °
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
## 2022-10-20 ENCOUNTER — Other Ambulatory Visit: Payer: Self-pay | Admitting: Family Medicine

## 2022-10-20 DIAGNOSIS — R2233 Localized swelling, mass and lump, upper limb, bilateral: Secondary | ICD-10-CM

## 2022-11-13 ENCOUNTER — Other Ambulatory Visit: Payer: No Typology Code available for payment source

## 2022-11-29 ENCOUNTER — Ambulatory Visit
Admission: RE | Admit: 2022-11-29 | Discharge: 2022-11-29 | Disposition: A | Discharge status: Home / Self Care | Payer: No Typology Code available for payment source | Source: Ambulatory Visit | Attending: Family Medicine | Admitting: Family Medicine

## 2022-11-29 DIAGNOSIS — R2233 Localized swelling, mass and lump, upper limb, bilateral: Secondary | ICD-10-CM

## 2024-02-21 ENCOUNTER — Other Ambulatory Visit (HOSPITAL_COMMUNITY): Payer: Self-pay | Admitting: Family Medicine

## 2024-02-21 ENCOUNTER — Ambulatory Visit (HOSPITAL_COMMUNITY)
Admission: RE | Admit: 2024-02-21 | Discharge: 2024-02-21 | Disposition: A | Discharge status: Home / Self Care | Source: Ambulatory Visit | Attending: Vascular Surgery | Admitting: Vascular Surgery

## 2024-02-21 DIAGNOSIS — M79601 Pain in right arm: Secondary | ICD-10-CM | POA: Diagnosis present
